# Patient Record
Sex: Female | Born: 1960 | Race: White | Hispanic: No | Marital: Married | Smoking: Current every day smoker
Health system: Southern US, Community
[De-identification: ages and names within clinical notes are randomized; demographics above are authoritative.]

## PROBLEM LIST (undated history)

## (undated) DIAGNOSIS — F419 Anxiety disorder, unspecified: Secondary | ICD-10-CM

## (undated) DIAGNOSIS — M419 Scoliosis, unspecified: Secondary | ICD-10-CM

## (undated) DIAGNOSIS — E039 Hypothyroidism, unspecified: Secondary | ICD-10-CM

## (undated) DIAGNOSIS — F32A Depression, unspecified: Secondary | ICD-10-CM

## (undated) DIAGNOSIS — R002 Palpitations: Secondary | ICD-10-CM

## (undated) DIAGNOSIS — I10 Essential (primary) hypertension: Secondary | ICD-10-CM

## (undated) DIAGNOSIS — F329 Major depressive disorder, single episode, unspecified: Secondary | ICD-10-CM

## (undated) HISTORY — DX: Essential (primary) hypertension: I10

## (undated) HISTORY — DX: Depression, unspecified: F32.A

## (undated) HISTORY — DX: Major depressive disorder, single episode, unspecified: F32.9

## (undated) HISTORY — DX: Hypothyroidism, unspecified: E03.9

## (undated) HISTORY — DX: Anxiety disorder, unspecified: F41.9

## (undated) HISTORY — DX: Palpitations: R00.2

## (undated) HISTORY — DX: Scoliosis, unspecified: M41.9

## (undated) HISTORY — PX: TONSILLECTOMY AND ADENOIDECTOMY: SUR1326

---

## 2010-10-29 ENCOUNTER — Encounter: Payer: Self-pay | Admitting: Cardiology

## 2010-10-29 ENCOUNTER — Encounter: Payer: Self-pay | Admitting: *Deleted

## 2010-10-29 ENCOUNTER — Ambulatory Visit (INDEPENDENT_AMBULATORY_CARE_PROVIDER_SITE_OTHER): Payer: BC Managed Care – PPO | Admitting: Cardiology

## 2010-10-29 VITALS — BP 135/88 | HR 82 | Ht 65.0 in | Wt 134.4 lb

## 2010-10-29 DIAGNOSIS — R0609 Other forms of dyspnea: Secondary | ICD-10-CM

## 2010-10-29 DIAGNOSIS — R0600 Dyspnea, unspecified: Secondary | ICD-10-CM | POA: Insufficient documentation

## 2010-10-29 DIAGNOSIS — R002 Palpitations: Secondary | ICD-10-CM | POA: Insufficient documentation

## 2010-10-29 DIAGNOSIS — R0989 Other specified symptoms and signs involving the circulatory and respiratory systems: Secondary | ICD-10-CM

## 2010-10-29 DIAGNOSIS — F419 Anxiety disorder, unspecified: Secondary | ICD-10-CM | POA: Insufficient documentation

## 2010-10-29 DIAGNOSIS — F17200 Nicotine dependence, unspecified, uncomplicated: Secondary | ICD-10-CM

## 2010-10-29 DIAGNOSIS — I10 Essential (primary) hypertension: Secondary | ICD-10-CM | POA: Insufficient documentation

## 2010-10-29 DIAGNOSIS — R609 Edema, unspecified: Secondary | ICD-10-CM | POA: Insufficient documentation

## 2010-10-29 DIAGNOSIS — Z720 Tobacco use: Secondary | ICD-10-CM | POA: Insufficient documentation

## 2010-10-29 DIAGNOSIS — R011 Cardiac murmur, unspecified: Secondary | ICD-10-CM | POA: Insufficient documentation

## 2010-10-29 MED ORDER — NICOTINE 14 MG/24HR TD PT24: 1.0000 | MEDICATED_PATCH | TRANSDERMAL | Status: AC

## 2010-10-29 NOTE — Assessment & Plan Note (Signed)
We discussed this at length. I gave her a prescription for nicotine patches.

## 2010-10-29 NOTE — Assessment & Plan Note (Signed)
Here blood pressure is borderline and no treatment is needed at this time.

## 2010-10-29 NOTE — Assessment & Plan Note (Signed)
I will check an echocardiogram to about a week the systolic murmur which may be aortic sclerosis.

## 2010-10-29 NOTE — Patient Instructions (Signed)
Follow up as scheduled. Nicoderm patch as directed. Your physician has requested that you have an echocardiogram. Echocardiography is a painless test that uses sound waves to create images of your heart. It provides your doctor with information about the size and shape of your heart and how well your heart's chambers and valves are working. This procedure takes approximately one hour. There are no restrictions for this procedure. Your physician has requested that you have an exercise tolerance test. For further information please visit [email or web address] Please also follow instruction sheet, as given. Your physician has recommended that you wear an event monitor. Event monitors are medical devices that record the heart's electrical activity. Doctors most often [place] these monitors to diagnose arrhythmias. Arrhythmias are problems with the speed or rhythm of the heartbeat. The monitor is a small, portable device. You can wear one while you do your normal daily activities. This is usually used to diagnose what is causing palpitations/syncope (passing out). Your physician discussed the hazards of tobacco use. Tobacco use cessation is recommended and techniques and options to help you quit were discussed.

## 2010-10-29 NOTE — Progress Notes (Signed)
HPI Patient presents for evaluation of tachycardia palpitations. This has been occurring for about 4 months. It has been on and off but progressive. It seems to have been now daily. It happens sporadically and at rest. She will feel her heart racing. She will feel lightheaded but has not had any frank syncope. She cannot bring these on. She does get dizzy spells apart from these as well very she does have some dyspnea with exertion and some mild intermittent chest discomfort is not reproducible with activity. She smokes cigarettes drinks caffeine every day. She has lost weight. She has a physical job doesn't exercise. She denies any PND or orthopnea. She has occasional mild lower extremity edema.   Allergies  Allergen Reactions  . Sulfa Antibiotics Hives and Swelling    Current Outpatient Prescriptions  Medication Sig Dispense Refill  . Ascorbic Acid (VITAMIN C) 500 MG tablet Take 500 mg by mouth daily.        . Aspirin-Salicylamide-Caffeine (BC HEADACHE POWDER PO) As needed.       Marland Kitchen buPROPion (WELLBUTRIN SR) 100 MG 12 hr tablet Take 100 mg by mouth daily.        . calcium carbonate (OS-CAL) 600 MG TABS Take 600 mg by mouth daily.        . clonazePAM (KLONOPIN) 1 MG tablet Take 1 mg by mouth. Takes 1/2 tab As needed.       . Omega-3 Fatty Acids (FISH OIL) 1200 MG CAPS Take by mouth 2 (two) times daily.        Marland Kitchen PARoxetine (PAXIL) 40 MG tablet Take 40 mg by mouth every morning.          Past Medical History  Diagnosis Date  . Palpitations   . HTN (hypertension)   . Edema   . Anxiety   . Tobacco abuse     Past Surgical History  Procedure Date  . Tonsillectomy and adenoidectomy     Family History  Problem Relation Age of Onset  . COPD Father     History   Social History  . Marital Status: Unknown    Spouse Name: N/A    Number of Children: 2  . Years of Education: N/A   Occupational History  . Engineer, drilling    Social History Main Topics  . Smoking status: Current  Everyday Smoker -- 2.0 packs/day  . Smokeless tobacco: Not on file  . Alcohol Use: No  . Drug Use: No  . Sexually Active: Not on file   Other Topics Concern  . Not on file   Social History Narrative  . No narrative on file    ROS:  Positive for headaches, sweats, fatigue, hearing loss, weakness, anxiety/depression, anemia, heartburn, stomach cramps, eczema, joint pains. Otherwise as stated in the history of present illness and negative for all other systems.  PHYSICAL EXAM BP 135/88  Pulse 82  Ht 5\' 5"  (1.651 m)  Wt 134 lb 6.4 oz (60.963 kg)  BMI 22.37 kg/m2 GENERAL:  Well appearing HEENT:  Pupils equal round and reactive, fundi not visualized, oral mucosa unremarkable NECK:  No jugular venous distention, waveform within normal limits, carotid upstroke brisk and symmetric, no bruits, no thyromegaly LYMPHATICS:  No cervical, inguinal adenopathy LUNGS:  Clear to auscultation bilaterally BACK:  No CVA tenderness CHEST:  Unremarkable HEART:  PMI not displaced or sustained,S1 and S2 within normal limits, no S3, no S4, no clicks, no rubs, soft apical and right upper sternal border murmur ABD:  Flat, positive  bowel sounds normal in frequency in pitch, no bruits, no rebound, no guarding, no midline pulsatile mass, no hepatomegaly, no splenomegaly EXT:  2 plus pulses throughout, no edema, no cyanosis no clubbing SKIN:  No rashes no nodules NEURO:  Cranial nerves II through XII grossly intact, motor grossly intact throughout PSYCH:  Cognitively intact, oriented to person place and time  EKG:   Sinus rhythm, rate 77, axis within normal limits, intervals within normal limits, no acute ST-T wave changes.  ASSESSMENT AND PLAN

## 2010-10-29 NOTE — Assessment & Plan Note (Signed)
She has some dyspnea. She has significant risk factors. I will screen with an exercise treadmill test.

## 2010-10-29 NOTE — Assessment & Plan Note (Signed)
She did have recent labs that were normal. I will check a 21 day event monitor.

## 2010-10-30 ENCOUNTER — Encounter: Payer: Self-pay | Admitting: Cardiology

## 2010-11-02 DIAGNOSIS — R0602 Shortness of breath: Secondary | ICD-10-CM

## 2010-11-02 DIAGNOSIS — R002 Palpitations: Secondary | ICD-10-CM

## 2010-11-09 DIAGNOSIS — R002 Palpitations: Secondary | ICD-10-CM

## 2010-12-11 ENCOUNTER — Ambulatory Visit: Payer: BC Managed Care – PPO | Admitting: Cardiology

## 2010-12-13 ENCOUNTER — Encounter: Payer: Self-pay | Admitting: *Deleted

## 2012-08-06 ENCOUNTER — Ambulatory Visit: Payer: BC Managed Care – PPO | Admitting: Physical Therapy

## 2013-11-30 ENCOUNTER — Other Ambulatory Visit: Payer: Self-pay | Admitting: Specialist

## 2013-11-30 DIAGNOSIS — M545 Low back pain, unspecified: Secondary | ICD-10-CM

## 2013-12-02 ENCOUNTER — Ambulatory Visit
Admission: RE | Admit: 2013-12-02 | Discharge: 2013-12-02 | Disposition: A | Discharge status: Home / Self Care | Payer: 59 | Source: Ambulatory Visit | Attending: Specialist | Admitting: Specialist

## 2013-12-02 DIAGNOSIS — M545 Low back pain, unspecified: Secondary | ICD-10-CM

## 2013-12-17 ENCOUNTER — Other Ambulatory Visit: Payer: Self-pay | Admitting: Specialist

## 2013-12-17 DIAGNOSIS — N281 Cyst of kidney, acquired: Secondary | ICD-10-CM

## 2013-12-21 ENCOUNTER — Ambulatory Visit
Admission: RE | Admit: 2013-12-21 | Discharge: 2013-12-21 | Disposition: A | Discharge status: Home / Self Care | Payer: 59 | Source: Ambulatory Visit | Attending: Specialist | Admitting: Specialist

## 2013-12-21 DIAGNOSIS — N281 Cyst of kidney, acquired: Secondary | ICD-10-CM

## 2013-12-27 ENCOUNTER — Ambulatory Visit (INDEPENDENT_AMBULATORY_CARE_PROVIDER_SITE_OTHER): Payer: 59 | Admitting: General Practice

## 2013-12-27 ENCOUNTER — Encounter: Payer: Self-pay | Admitting: General Practice

## 2013-12-27 ENCOUNTER — Ambulatory Visit (INDEPENDENT_AMBULATORY_CARE_PROVIDER_SITE_OTHER): Payer: 59

## 2013-12-27 ENCOUNTER — Telehealth: Payer: Self-pay | Admitting: Family Medicine

## 2013-12-27 VITALS — BP 120/86 | HR 62 | Temp 98.6°F | Ht 65.0 in | Wt 151.4 lb

## 2013-12-27 DIAGNOSIS — K5900 Constipation, unspecified: Secondary | ICD-10-CM

## 2013-12-27 DIAGNOSIS — R109 Unspecified abdominal pain: Secondary | ICD-10-CM

## 2013-12-27 NOTE — Progress Notes (Signed)
   Subjective:    Patient ID: Connie Schultz, female    DOB: 04/14/1961, 53 y.o.   MRN: [id number]  Abdominal Pain This is a new problem. Episode onset: onset 3 days ago. The onset quality is gradual. The problem occurs daily. The problem has been unchanged. The pain is located in the generalized abdominal region. The pain is at a severity of 3/10. The quality of the pain is aching. The abdominal pain does not radiate. Associated symptoms include constipation and diarrhea. Pertinent negatives include no dysuria, fever, frequency, headaches, hematuria, nausea, vomiting or weight loss. The pain is aggravated by eating. The pain is relieved by nothing. She has tried nothing for the symptoms. Her past medical history is significant for GERD.  Patient reports a history of chronic constipation, but no pharmacological management. Connie Schultz reports her normal bowel movements occur once weekly. Reports being seen by GI specialist 3 years ago and follow up colonoscopy 2 more years.     Review of Systems  Constitutional: Negative for fever, chills and weight loss.  Respiratory: Negative for chest tightness and shortness of breath.   Cardiovascular: Negative for chest pain and palpitations.  Gastrointestinal: Positive for abdominal pain, diarrhea and constipation. Negative for nausea and vomiting.  Genitourinary: Negative for dysuria, frequency and hematuria.  Neurological: Negative for dizziness, weakness and headaches.       Objective:   Physical Exam  Constitutional: She is oriented to person, place, and time. She appears well-developed and well-nourished.  Cardiovascular: Normal rate, regular rhythm and normal heart sounds.   Pulmonary/Chest: Effort normal and breath sounds normal.  Abdominal: Soft. Bowel sounds are normal. She exhibits no distension. There is generalized tenderness. There is no CVA tenderness.  Neurological: She is alert and oriented to person, place, and time.  Skin: Skin is warm and  dry.  Psychiatric: She has a normal mood and affect.   [facility] reading (PRIMARY) by Coralie KeensMae E. Anjel Pardo, FNP-C, moderate amount of stool noted in colon.        Assessment & Plan:  1. Unspecified constipation, 2. Abdominal pain, unspecified abdominal location -discussed and provided patient educational material on constipation -Miralax 17grams daily, for 1-4 days, until bowel movement  Increase fluid intake (water) Increase fiber in diet (fruits, vegetables, whole grains) Take stool softner daily Follow up if no bowel movement in 24 hours Seek emergency medical treatment if abdominal pain worsens If symptoms persists, will refer to GI Patient verbalized understanding Coralie KeensMae E. Glennis Borger, FNP-C

## 2013-12-27 NOTE — Patient Instructions (Signed)

## 2013-12-27 NOTE — Telephone Encounter (Signed)
Appt given for today per patients request 

## 2014-01-17 ENCOUNTER — Telehealth: Payer: Self-pay | Admitting: General Practice

## 2014-01-17 NOTE — Telephone Encounter (Signed)
appt given per patients request 

## 2014-01-18 ENCOUNTER — Ambulatory Visit: Payer: 59 | Admitting: Nurse Practitioner

## 2014-01-24 ENCOUNTER — Telehealth: Payer: Self-pay | Admitting: Nurse Practitioner

## 2014-01-24 DIAGNOSIS — R194 Change in bowel habit: Secondary | ICD-10-CM

## 2014-01-24 NOTE — Telephone Encounter (Signed)
referral  Made \

## 2014-01-24 NOTE — Telephone Encounter (Signed)
Patient notified

## 2014-01-25 ENCOUNTER — Telehealth: Payer: Self-pay | Admitting: Nurse Practitioner

## 2014-01-25 NOTE — Telephone Encounter (Signed)
Patient aware will come back in if gets any worse please check on referral

## 2014-01-25 NOTE — Telephone Encounter (Signed)
Stool softner Force fluids Increase fiber in diet I am not real sure what is going in because she was seen by M. [place]- may NTBS

## 2014-05-11 ENCOUNTER — Telehealth: Payer: Self-pay | Admitting: Family Medicine

## 2014-05-11 NOTE — Telephone Encounter (Signed)
Pt given appt with bill oxford 11/20 @ 10:45

## 2014-05-11 NOTE — Telephone Encounter (Signed)
Returning Karla's call.  Call [phone]

## 2014-05-13 ENCOUNTER — Ambulatory Visit: Payer: 59 | Admitting: Family Medicine

## 2014-08-02 ENCOUNTER — Telehealth: Payer: Self-pay | Admitting: Family Medicine

## 2014-08-02 NOTE — Telephone Encounter (Signed)
Patient aware she will need to be seen  

## 2014-08-09 ENCOUNTER — Telehealth: Payer: Self-pay | Admitting: Nurse Practitioner

## 2014-08-09 ENCOUNTER — Telehealth: Payer: Self-pay | Admitting: Family

## 2014-08-09 NOTE — Telephone Encounter (Signed)
Appointment given for Thursday at 4:40 with Jannifer Rodneyhristy Hawks, FNP

## 2014-08-10 NOTE — Telephone Encounter (Signed)
Attempted to call patient at number but it wasn't a working number. Tried home number with no answer. No lab orders have come in per medical records and lab

## 2014-08-11 ENCOUNTER — Telehealth: Payer: Self-pay | Admitting: Family

## 2014-08-11 ENCOUNTER — Ambulatory Visit: Payer: Self-pay | Admitting: Family

## 2014-08-12 NOTE — Telephone Encounter (Signed)
Pt given appt with Jannifer Rodneyhristy Hawks 2/24 at 9:50.

## 2014-08-17 ENCOUNTER — Encounter: Payer: Self-pay | Admitting: Family

## 2014-08-17 ENCOUNTER — Ambulatory Visit (INDEPENDENT_AMBULATORY_CARE_PROVIDER_SITE_OTHER): Payer: 59 | Admitting: Family

## 2014-08-17 VITALS — BP 101/69 | HR 82 | Temp 97.4°F | Ht 65.0 in | Wt 149.0 lb

## 2014-08-17 DIAGNOSIS — F329 Major depressive disorder, single episode, unspecified: Secondary | ICD-10-CM

## 2014-08-17 DIAGNOSIS — F419 Anxiety disorder, unspecified: Secondary | ICD-10-CM

## 2014-08-17 DIAGNOSIS — I4891 Unspecified atrial fibrillation: Secondary | ICD-10-CM

## 2014-08-17 DIAGNOSIS — E039 Hypothyroidism, unspecified: Secondary | ICD-10-CM

## 2014-08-17 DIAGNOSIS — F32A Depression, unspecified: Secondary | ICD-10-CM

## 2014-08-17 DIAGNOSIS — I10 Essential (primary) hypertension: Secondary | ICD-10-CM

## 2014-08-17 DIAGNOSIS — Z13220 Encounter for screening for lipoid disorders: Secondary | ICD-10-CM

## 2014-08-17 DIAGNOSIS — Z1321 Encounter for screening for nutritional disorder: Secondary | ICD-10-CM

## 2014-08-17 DIAGNOSIS — Z23 Encounter for immunization: Secondary | ICD-10-CM

## 2014-08-17 MED ORDER — PAROXETINE HCL 40 MG PO TABS: 40.0000 mg | ORAL_TABLET | ORAL | Status: DC

## 2014-08-17 MED ORDER — METOPROLOL SUCCINATE ER 25 MG PO TB24: 25.0000 mg | ORAL_TABLET | Freq: Every day | ORAL | Status: DC

## 2014-08-17 MED ORDER — CLONAZEPAM 1 MG PO TABS: 1.0000 mg | ORAL_TABLET | Freq: Every day | ORAL | Status: DC

## 2014-08-17 MED ORDER — LEVOTHYROXINE SODIUM 50 MCG PO TABS: 50.0000 ug | ORAL_TABLET | Freq: Every day | ORAL | Status: DC

## 2014-08-17 NOTE — Progress Notes (Signed)
Subjective:    Patient ID: Connie Schultz, female    DOB: Mar 28, 1961, 54 y.o.   MRN: [id number]  Hypertension This is a chronic problem. The current episode started more than 1 year ago. The problem has been resolved since onset. The problem is controlled. Associated symptoms include anxiety, palpitations and peripheral edema ("at times"). Pertinent negatives include no headaches, malaise/fatigue or shortness of breath. Risk factors for coronary artery disease include post-menopausal state and sedentary lifestyle. Past treatments include beta blockers. The current treatment provides moderate improvement. Hypertensive end-organ damage includes a thyroid problem. There is no history of kidney disease, CAD/MI, CVA or heart failure. There is no history of sleep apnea.  Thyroid Problem Presents for initial visit. Symptoms include anxiety, constipation, depressed mood and palpitations. Patient reports no diarrhea, dry skin, hair loss or visual change. The symptoms have been worsening. Past treatments include levothyroxine. The treatment provided moderate relief. There is no history of heart failure.  Anxiety Presents for initial visit. Onset was 1 to 6 months ago. The problem has been resolved. Symptoms include depressed mood, nervous/anxious behavior and palpitations. Patient reports no excessive worry, insomnia or shortness of breath. Symptoms occur occasionally.   Her past medical history is significant for anxiety/panic attacks and depression.  A Fib Pt currently taking metoprolol daily. Pt states she still have palpations at times.     Review of Systems  Constitutional: Negative.  Negative for malaise/fatigue.  HENT: Negative.   Eyes: Negative.   Respiratory: Negative.  Negative for shortness of breath.   Cardiovascular: Positive for palpitations.  Gastrointestinal: Positive for constipation. Negative for diarrhea.  Endocrine: Negative.   Genitourinary: Negative.   Musculoskeletal: Negative.    Neurological: Negative.  Negative for headaches.  Hematological: Negative.   Psychiatric/Behavioral: The patient is nervous/anxious. The patient does not have insomnia.   All other systems reviewed and are negative.      Objective:   Physical Exam  Constitutional: She is oriented to person, place, and time. She appears well-developed and well-nourished. No distress.  HENT:  Head: Normocephalic and atraumatic.  Right Ear: External ear normal.  Left Ear: External ear normal.  Nose: Nose normal.  Mouth/Throat: Oropharynx is clear and moist.  Eyes: Pupils are equal, round, and reactive to light.  Neck: Normal range of motion. Neck supple. No thyromegaly present.  Cardiovascular: Normal rate, regular rhythm, normal heart sounds and intact distal pulses.   No murmur heard. Pulmonary/Chest: Effort normal and breath sounds normal. No respiratory distress. She has no wheezes.  Abdominal: Soft. Bowel sounds are normal. She exhibits no distension. There is no tenderness.  Musculoskeletal: Normal range of motion. She exhibits no edema or tenderness.  Neurological: She is alert and oriented to person, place, and time. She has normal reflexes. No cranial nerve deficit.  Skin: Skin is warm and dry.  Psychiatric: She has a normal mood and affect. Her behavior is normal. Judgment and thought content normal.  Vitals reviewed.     BP 101/69 mmHg  Pulse 82  Temp(Src) 97.4 F (36.3 C) (Oral)  Ht 5' 5"  (1.651 m)  Wt 149 lb (67.586 kg)  BMI 24.79 kg/m2     Assessment & Plan:  1. Essential hypertension - CMP14+EGFR - metoprolol succinate (TOPROL-XL) 25 MG 24 hr tablet; Take 1 tablet (25 mg total) by mouth daily.  Dispense: 90 tablet; Refill: 3  2. Anxiety - CMP14+EGFR - PARoxetine (PAXIL) 40 MG tablet; Take 1 tablet (40 mg total) by mouth every morning.  Dispense:  90 tablet; Refill: 3 - clonazePAM (KLONOPIN) 1 MG tablet; Take 1 tablet (1 mg total) by mouth at bedtime.  Dispense: 30 tablet;  Refill: 3  3. Depression - CMP14+EGFR - PARoxetine (PAXIL) 40 MG tablet; Take 1 tablet (40 mg total) by mouth every morning.  Dispense: 90 tablet; Refill: 3  4. Hypothyroidism, unspecified hypothyroidism type - levothyroxine (SYNTHROID, LEVOTHROID) 50 MCG tablet; Take 1 tablet (50 mcg total) by mouth daily.  Dispense: 90 tablet; Refill: 3 - CMP14+EGFR  5. Atrial fibrillation, unspecified - CMP14+EGFR - metoprolol succinate (TOPROL-XL) 25 MG 24 hr tablet; Take 1 tablet (25 mg total) by mouth daily.  Dispense: 90 tablet; Refill: 3  6. Screening for cholesterol leve - Lipid panel  7. Encounter for vitamin deficiency screening  - Vit D  25 hydroxy (rtn osteoporosis monitoring)   Continue all meds Labs pending Health Maintenance reviewed Diet and exercise encouraged RTO 6 weeks for thyroid recheck  Evelina Dun, FNP

## 2014-08-17 NOTE — Addendum Note (Signed)
Addended by: Tommas OlpHANDY, Derrien Anschutz N on: 08/17/2014 02:06 PM   Modules accepted: Orders

## 2014-08-17 NOTE — Addendum Note (Signed)
Addended by: Gwenith DailyHUDY, Seyed Heffley N on: 08/17/2014 10:41 AM   Modules accepted: Orders

## 2014-08-17 NOTE — Patient Instructions (Signed)
Health Maintenance Adopting a healthy lifestyle and getting preventive care can go a long way to promote health and wellness. Talk with your health care provider about what schedule of regular examinations is right for you. This is a good chance for you to check in with your provider about disease prevention and staying healthy. In between checkups, there are plenty of things you can do on your own. Experts have done a lot of research about which lifestyle changes and preventive measures are most likely to keep you healthy. Ask your health care provider for more information. WEIGHT AND DIET  Eat a healthy diet  Be sure to include plenty of vegetables, fruits, low-fat dairy products, and lean protein.  Do not eat a lot of foods high in solid fats, added sugars, or salt.  Get regular exercise. This is one of the most important things you can do for your health.  Most adults should exercise for at least 150 minutes each week. The exercise should increase your heart rate and make you sweat (moderate-intensity exercise).  Most adults should also do strengthening exercises at least twice a week. This is in addition to the moderate-intensity exercise.  Maintain a healthy weight  Body mass index (BMI) is a measurement that can be used to identify possible weight problems. It estimates body fat based on height and weight. Your health care provider can help determine your BMI and help you achieve or maintain a healthy weight.  For females 25 years of age and older:   A BMI below 18.5 is considered underweight.  A BMI of 18.5 to 24.9 is normal.  A BMI of 25 to 29.9 is considered overweight.  A BMI of 30 and above is considered obese.  Watch levels of cholesterol and blood lipids  You should start having your blood tested for lipids and cholesterol at 52 years of age, then have this test every 5 years.  You may need to have your cholesterol levels checked more often if:  Your lipid or  cholesterol levels are high.  You are older than 52 years of age.  You are at high risk for heart disease.  CANCER SCREENING   Lung Cancer  Lung cancer screening is recommended for adults 97-92 years old who are at high risk for lung cancer because of a history of smoking.  A yearly low-dose CT scan of the lungs is recommended for people who:  Currently smoke.  Have quit within the past 15 years.  Have at least a 30-pack-year history of smoking. A pack year is smoking an average of one pack of cigarettes a day for 1 year.  Yearly screening should continue until it has been 15 years since you quit.  Yearly screening should stop if you develop a health problem that would prevent you from having lung cancer treatment.  Breast Cancer  Practice breast self-awareness. This means understanding how your breasts normally appear and feel.  It also means doing regular breast self-exams. Let your health care provider know about any changes, no matter how small.  If you are in your 20s or 30s, you should have a clinical breast exam (CBE) by a health care provider every 1-3 years as part of a regular health exam.  If you are 76 or older, have a CBE every year. Also consider having a breast X-ray (mammogram) every year.  If you have a family history of breast cancer, talk to your health care provider about genetic screening.  If you are  at high risk for breast cancer, talk to your health care provider about having an MRI and a mammogram every year.  Breast cancer gene (BRCA) assessment is recommended for women who have family members with BRCA-related cancers. BRCA-related cancers include:  Breast.  Ovarian.  Tubal.  Peritoneal cancers.  Results of the assessment will determine the need for genetic counseling and BRCA1 and BRCA2 testing. Cervical Cancer Routine pelvic examinations to screen for cervical cancer are no longer recommended for nonpregnant women who are considered low  risk for cancer of the pelvic organs (ovaries, uterus, and vagina) and who do not have symptoms. A pelvic examination may be necessary if you have symptoms including those associated with pelvic infections. Ask your health care provider if a screening pelvic exam is right for you.   The Pap test is the screening test for cervical cancer for women who are considered at risk.  If you had a hysterectomy for a problem that was not cancer or a condition that could lead to cancer, then you no longer need Pap tests.  If you are older than 65 years, and you have had normal Pap tests for the past 10 years, you no longer need to have Pap tests.  If you have had past treatment for cervical cancer or a condition that could lead to cancer, you need Pap tests and screening for cancer for at least 20 years after your treatment.  If you no longer get a Pap test, assess your risk factors if they change (such as having a new sexual partner). This can affect whether you should start being screened again.  Some women have medical problems that increase their chance of getting cervical cancer. If this is the case for you, your health care provider may recommend more frequent screening and Pap tests.  The human papillomavirus (HPV) test is another test that may be used for cervical cancer screening. The HPV test looks for the virus that can cause cell changes in the cervix. The cells collected during the Pap test can be tested for HPV.  The HPV test can be used to screen women 30 years of age and older. Getting tested for HPV can extend the interval between normal Pap tests from three to five years.  An HPV test also should be used to screen women of any age who have unclear Pap test results.  After 52 years of age, women should have HPV testing as often as Pap tests.  Colorectal Cancer  This type of cancer can be detected and often prevented.  Routine colorectal cancer screening usually begins at 52 years of  age and continues through 52 years of age.  Your health care provider may recommend screening at an earlier age if you have risk factors for colon cancer.  Your health care provider may also recommend using home test kits to check for hidden blood in the stool.  A small camera at the end of a tube can be used to examine your colon directly (sigmoidoscopy or colonoscopy). This is done to check for the earliest forms of colorectal cancer.  Routine screening usually begins at age 50.  Direct examination of the colon should be repeated every 5-10 years through 52 years of age. However, you may need to be screened more often if early forms of precancerous polyps or small growths are found. Skin Cancer  Check your skin from head to toe regularly.  Tell your health care provider about any new moles or changes in   moles, especially if there is a change in a mole's shape or color.  Also tell your health care provider if you have a mole that is larger than the size of a pencil eraser.  Always use sunscreen. Apply sunscreen liberally and repeatedly throughout the day.  Protect yourself by wearing long sleeves, pants, a wide-brimmed hat, and sunglasses whenever you are outside. HEART DISEASE, DIABETES, AND HIGH BLOOD PRESSURE   Have your blood pressure checked at least every 1-2 years. High blood pressure causes heart disease and increases the risk of stroke.  If you are between 75 years and 42 years old, ask your health care provider if you should take aspirin to prevent strokes.  Have regular diabetes screenings. This involves taking a blood sample to check your fasting blood sugar level.  If you are at a normal weight and have a low risk for diabetes, have this test once every three years after 52 years of age.  If you are overweight and have a high risk for diabetes, consider being tested at a younger age or more often. PREVENTING INFECTION  Hepatitis B  If you have a higher risk for  hepatitis B, you should be screened for this virus. You are considered at high risk for hepatitis B if:  You were born in a country where hepatitis B is common. Ask your health care provider which countries are considered high risk.  Your parents were born in a high-risk country, and you have not been immunized against hepatitis B (hepatitis B vaccine).  You have HIV or AIDS.  You use needles to inject street drugs.  You live with someone who has hepatitis B.  You have had sex with someone who has hepatitis B.  You get hemodialysis treatment.  You take certain medicines for conditions, including cancer, organ transplantation, and autoimmune conditions. Hepatitis C  Blood testing is recommended for:  Everyone born from [birth year] through 1963.  Anyone with known risk factors for hepatitis C. Sexually transmitted infections (STIs)  You should be screened for sexually transmitted infections (STIs) including gonorrhea and chlamydia if:  You are sexually active and are younger than 52 years of age.  You are older than 52 years of age and your health care provider tells you that you are at risk for this type of infection.  Your sexual activity has changed since you were last screened and you are at an increased risk for chlamydia or gonorrhea. Ask your health care provider if you are at risk.  If you do not have HIV, but are at risk, it may be recommended that you take a prescription medicine daily to prevent HIV infection. This is called pre-exposure prophylaxis (PrEP). You are considered at risk if:  You are sexually active and do not regularly use condoms or know the HIV status of your partner(s).  You take drugs by injection.  You are sexually active with a partner who has HIV. Talk with your health care provider about whether you are at high risk of being infected with HIV. If you choose to begin PrEP, you should first be tested for HIV. You should then be tested every 3 months for  as long as you are taking PrEP.  PREGNANCY   If you are premenopausal and you may become pregnant, ask your health care provider about preconception counseling.  If you may become pregnant, take 400 to 800 micrograms (mcg) of folic acid every day.  If you want to prevent pregnancy, talk to your  health care provider about birth control (contraception). OSTEOPOROSIS AND MENOPAUSE   Osteoporosis is a disease in which the bones lose minerals and strength with aging. This can result in serious bone fractures. Your risk for osteoporosis can be identified using a bone density scan.  If you are 65 years of age or older, or if you are at risk for osteoporosis and fractures, ask your health care provider if you should be screened.  Ask your health care provider whether you should take a calcium or vitamin D supplement to lower your risk for osteoporosis.  Menopause may have certain physical symptoms and risks.  Hormone replacement therapy may reduce some of these symptoms and risks. Talk to your health care provider about whether hormone replacement therapy is right for you.  HOME CARE INSTRUCTIONS   Schedule regular health, dental, and eye exams.  Stay current with your immunizations.   Do not use any tobacco products including cigarettes, chewing tobacco, or electronic cigarettes.  If you are pregnant, do not drink alcohol.  If you are breastfeeding, limit how much and how often you drink alcohol.  Limit alcohol intake to no more than 1 drink per day for nonpregnant women. One drink equals 12 ounces of beer, 5 ounces of wine, or 1 ounces of hard liquor.  Do not use street drugs.  Do not share needles.  Ask your health care provider for help if you need support or information about quitting drugs.  Tell your health care provider if you often feel depressed.  Tell your health care provider if you have ever been abused or do not feel safe at home. Document Released: 12/24/2010  Document Revised: 10/25/2013 Document Reviewed: 05/12/2013 Exit[employer] Patient Information 2015 [employer]. This information is not intended to replace advice given to you by your health care provider. Make sure you discuss any questions you have with your health care provider. Hypothyroidism The thyroid is a large gland located in the lower front of your neck. The thyroid gland helps control metabolism. Metabolism is how your body handles food. It controls metabolism with the hormone thyroxine. When this gland is underactive (hypothyroid), it produces too little hormone.  CAUSES These include:   Absence or destruction of thyroid tissue.  Goiter due to iodine deficiency.  Goiter due to medications.  Congenital defects (since birth).  Problems with the pituitary. This causes a lack of TSH (thyroid stimulating hormone). This hormone tells the thyroid to turn out more hormone. SYMPTOMS  Lethargy (feeling as though you have no energy)  Cold intolerance  Weight gain (in spite of normal food intake)  Dry skin  Coarse hair  Menstrual irregularity (if severe, may lead to infertility)  Slowing of thought processes Cardiac problems are also caused by insufficient amounts of thyroid hormone. Hypothyroidism in the newborn is cretinism, and is an extreme form. It is important that this form be treated adequately and immediately or it will lead rapidly to retarded physical and mental development. DIAGNOSIS  To prove hypothyroidism, your caregiver may do blood tests and ultrasound tests. Sometimes the signs are hidden. It may be necessary for your caregiver to watch this illness with blood tests either before or after diagnosis and treatment. TREATMENT  Low levels of thyroid hormone are increased by using synthetic thyroid hormone. This is a safe, effective treatment. It usually takes about four weeks to gain the full effects of the medication. After you have the full effect of the medication,  it will generally take another four weeks   for problems to leave. Your caregiver may start you on low doses. If you have had heart problems the dose may be gradually increased. It is generally not an emergency to get rapidly to normal. HOME CARE INSTRUCTIONS   Take your medications as your caregiver suggests. Let your caregiver know of any medications you are taking or start taking. Your caregiver will help you with dosage schedules.  As your condition improves, your dosage needs may increase. It will be necessary to have continuing blood tests as suggested by your caregiver.  Report all suspected medication side effects to your caregiver. SEEK MEDICAL CARE IF: Seek medical care if you develop:  Sweating.  Tremulousness (tremors).  Anxiety.  Rapid weight loss.  Heat intolerance.  Emotional swings.  Diarrhea.  Weakness. SEEK IMMEDIATE MEDICAL CARE IF:  You develop chest pain, an irregular heart beat (palpitations), or a rapid heart beat. MAKE SURE YOU:   Understand these instructions.  Will watch your condition.  Will get help right away if you are not doing well or get worse. Document Released: 06/10/2005 Document Revised: 09/02/2011 Document Reviewed: 01/29/2008 Exit[employer] Patient Information 2015 [employer]. This information is not intended to replace advice given to you by your health care provider. Make sure you discuss any questions you have with your health care provider.  

## 2014-09-22 ENCOUNTER — Ambulatory Visit: Payer: 59 | Admitting: Physician Assistant

## 2014-09-22 ENCOUNTER — Encounter: Payer: Self-pay | Admitting: Physician Assistant

## 2014-09-22 VITALS — BP 116/77 | HR 71 | Temp 97.2°F | Ht 65.0 in | Wt 148.6 lb

## 2014-09-22 DIAGNOSIS — K529 Noninfective gastroenteritis and colitis, unspecified: Secondary | ICD-10-CM

## 2014-09-22 DIAGNOSIS — R1033 Periumbilical pain: Secondary | ICD-10-CM

## 2014-09-22 DIAGNOSIS — R1011 Right upper quadrant pain: Secondary | ICD-10-CM

## 2014-09-22 NOTE — Progress Notes (Signed)
   Subjective:    Patient ID: Connie Schultz, female    DOB: 12/09/1960, 54 y.o.   MRN: [id number]  HPI 54 y/o with c/o low to mid back pain, periumbilical pain x 3 days. Has took Tylenol with mild relief in back pain. Worse with eating. Better with not eating.     Review of Systems  Constitutional: Negative.   Respiratory: Negative.   Cardiovascular: Negative.   Gastrointestinal: Positive for nausea, abdominal pain (ache, burning, periumbilical) and diarrhea (chronic). Negative for vomiting, constipation and blood in stool.  Genitourinary: Negative for dysuria, urgency, hematuria, flank pain, vaginal bleeding and difficulty urinating.  Musculoskeletal: Positive for back pain (low ;to mid back).  Neurological: Positive for dizziness.       Objective:   Physical Exam  Constitutional: She appears well-developed and well-nourished. No distress.  Pulmonary/Chest: Effort normal and breath sounds normal.  Abdominal: Soft. Bowel sounds are normal. She exhibits no distension and no mass. There is tenderness (RUQ, periumbilical). There is no rebound and no guarding.  Negative murphys sign   Musculoskeletal: Normal range of motion. She exhibits no tenderness.  Negative for ttp on back  Neurological: She is alert.  Skin: Skin is warm. She is not diaphoretic.  Vitals reviewed.         Assessment & Plan:  1. RUQ pain 2. Back pain 3. Periumbilical pain 4. Chronic diarrhea  Referral to Gastroenterology for further evaluation to r/o cholelithiasis. Report to ER if s/s worsen prior to GI appt. Also suggested patient take OTC Pepcid/Prilosec daily

## 2014-09-29 ENCOUNTER — Ambulatory Visit (INDEPENDENT_AMBULATORY_CARE_PROVIDER_SITE_OTHER): Payer: 59 | Admitting: Family

## 2014-09-29 ENCOUNTER — Encounter: Payer: Self-pay | Admitting: Family

## 2014-09-29 VITALS — BP 114/73 | HR 83 | Temp 98.0°F | Ht 65.0 in | Wt 145.0 lb

## 2014-09-29 DIAGNOSIS — E039 Hypothyroidism, unspecified: Secondary | ICD-10-CM

## 2014-09-29 NOTE — Patient Instructions (Signed)
Hypothyroidism The thyroid is a large gland located in the lower front of your neck. The thyroid gland helps control metabolism. Metabolism is how your body handles food. It controls metabolism with the hormone thyroxine. When this gland is underactive (hypothyroid), it produces too little hormone.  CAUSES These include:   Absence or destruction of thyroid tissue.  Goiter due to iodine deficiency.  Goiter due to medications.  Congenital defects (since birth).  Problems with the pituitary. This causes a lack of TSH (thyroid stimulating hormone). This hormone tells the thyroid to turn out more hormone. SYMPTOMS  Lethargy (feeling as though you have no energy)  Cold intolerance  Weight gain (in spite of normal food intake)  Dry skin  Coarse hair  Menstrual irregularity (if severe, may lead to infertility)  Slowing of thought processes Cardiac problems are also caused by insufficient amounts of thyroid hormone. Hypothyroidism in the newborn is cretinism, and is an extreme form. It is important that this form be treated adequately and immediately or it will lead rapidly to retarded physical and mental development. DIAGNOSIS  To prove hypothyroidism, your caregiver may do blood tests and ultrasound tests. Sometimes the signs are hidden. It may be necessary for your caregiver to watch this illness with blood tests either before or after diagnosis and treatment. TREATMENT  Low levels of thyroid hormone are increased by using synthetic thyroid hormone. This is a safe, effective treatment. It usually takes about four weeks to gain the full effects of the medication. After you have the full effect of the medication, it will generally take another four weeks for problems to leave. Your caregiver may start you on low doses. If you have had heart problems the dose may be gradually increased. It is generally not an emergency to get rapidly to normal. HOME CARE INSTRUCTIONS   Take your  medications as your caregiver suggests. Let your caregiver know of any medications you are taking or start taking. Your caregiver will help you with dosage schedules.  As your condition improves, your dosage needs may increase. It will be necessary to have continuing blood tests as suggested by your caregiver.  Report all suspected medication side effects to your caregiver. SEEK MEDICAL CARE IF: Seek medical care if you develop:  Sweating.  Tremulousness (tremors).  Anxiety.  Rapid weight loss.  Heat intolerance.  Emotional swings.  Diarrhea.  Weakness. SEEK IMMEDIATE MEDICAL CARE IF:  You develop chest pain, an irregular heart beat (palpitations), or a rapid heart beat. MAKE SURE YOU:   Understand these instructions.  Will watch your condition.  Will get help right away if you are not doing well or get worse. Document Released: 06/10/2005 Document Revised: 09/02/2011 Document Reviewed: 01/29/2008 Exit[employer] Patient Information 2015 [employer]. This information is not intended to replace advice given to you by your health care provider. Make sure you discuss any questions you have with your health care provider.  

## 2014-09-29 NOTE — Progress Notes (Signed)
   Subjective:    Patient ID: Connie Schultz, female    DOB: 03/11/1961, 54 y.o.   MRN: [id number]  Pt presents to the office to recheck thyroid.  Thyroid Problem Presents for follow-up visit. Symptoms include diarrhea and dry skin. Patient reports no constipation, heat intolerance, palpitations or weight gain. The symptoms have been stable. Past treatments include levothyroxine. The treatment provided moderate relief.      Review of Systems  Constitutional: Negative.  Negative for weight gain.  HENT: Negative.   Eyes: Negative.   Respiratory: Negative.  Negative for shortness of breath.   Cardiovascular: Negative.  Negative for palpitations.  Gastrointestinal: Positive for diarrhea. Negative for constipation.  Endocrine: Negative.  Negative for heat intolerance.  Genitourinary: Negative.   Musculoskeletal: Negative.   Neurological: Negative.  Negative for headaches.  Hematological: Negative.   Psychiatric/Behavioral: Negative.   All other systems reviewed and are negative.      Objective:   Physical Exam  Constitutional: She is oriented to person, place, and time. She appears well-developed and well-nourished. No distress.  HENT:  Head: Normocephalic and atraumatic.  Right Ear: External ear normal.  Left Ear: External ear normal.  Nose: Nose normal.  Mouth/Throat: Oropharynx is clear and moist.  Eyes: Pupils are equal, round, and reactive to light.  Neck: Normal range of motion. Neck supple. No thyromegaly present.  Cardiovascular: Normal rate, regular rhythm, normal heart sounds and intact distal pulses.   No murmur heard. Pulmonary/Chest: Effort normal and breath sounds normal. No respiratory distress. She has no wheezes.  Abdominal: Soft. Bowel sounds are normal. She exhibits no distension. There is no tenderness.  Musculoskeletal: Normal range of motion. She exhibits no edema or tenderness.  Neurological: She is alert and oriented to person, place, and time. She has  normal reflexes. No cranial nerve deficit.  Skin: Skin is warm and dry.  Psychiatric: She has a normal mood and affect. Her behavior is normal. Judgment and thought content normal.  Vitals reviewed.   BP 114/73 mmHg  Pulse 83  Temp(Src) 98 F (36.7 C) (Oral)  Ht 5\' 5"  (1.651 m)  Wt 145 lb (65.772 kg)  BMI 24.13 kg/m2       Assessment & Plan:  1. Hypothyroidism, unspecified hypothyroidism type - Thyroid Panel With TSH   Continue all meds Labs pending Health Maintenance reviewed Diet and exercise encouraged RTO 6 months  Jannifer Rodneyhristy Ellayna Hilligoss, FNP

## 2014-09-30 ENCOUNTER — Telehealth: Payer: Self-pay | Admitting: *Deleted

## 2014-09-30 LAB — THYROID PANEL WITH TSH
Free Thyroxine Index: 1.7 (ref 1.2–4.9)
T3 UPTAKE RATIO: 24 % (ref 24–39)
T4, Total: 7.2 ug/dL (ref 4.5–12.0)
TSH: 3.88 u[IU]/mL (ref 0.450–4.500)

## 2014-09-30 NOTE — Telephone Encounter (Signed)
-----   Message from Junie Spencerhristy A Hawks, FNP sent at 09/30/2014  8:56 AM EDT ----- Thyroid levels WNL

## 2014-09-30 NOTE — Progress Notes (Signed)
Lmtcb/ww 4/8 

## 2015-02-13 ENCOUNTER — Encounter: Payer: Self-pay | Admitting: Family Medicine

## 2015-02-13 ENCOUNTER — Telehealth: Payer: Self-pay | Admitting: Family

## 2015-02-13 ENCOUNTER — Ambulatory Visit (INDEPENDENT_AMBULATORY_CARE_PROVIDER_SITE_OTHER): Payer: 59 | Admitting: Family Medicine

## 2015-02-13 VITALS — BP 132/83 | HR 72 | Temp 97.8°F | Ht 65.0 in | Wt 153.2 lb

## 2015-02-13 DIAGNOSIS — I10 Essential (primary) hypertension: Secondary | ICD-10-CM

## 2015-02-13 DIAGNOSIS — F329 Major depressive disorder, single episode, unspecified: Secondary | ICD-10-CM

## 2015-02-13 DIAGNOSIS — R0600 Dyspnea, unspecified: Secondary | ICD-10-CM

## 2015-02-13 DIAGNOSIS — R002 Palpitations: Secondary | ICD-10-CM

## 2015-02-13 DIAGNOSIS — Z720 Tobacco use: Secondary | ICD-10-CM

## 2015-02-13 DIAGNOSIS — E039 Hypothyroidism, unspecified: Secondary | ICD-10-CM

## 2015-02-13 DIAGNOSIS — F419 Anxiety disorder, unspecified: Secondary | ICD-10-CM

## 2015-02-13 DIAGNOSIS — R609 Edema, unspecified: Secondary | ICD-10-CM | POA: Diagnosis not present

## 2015-02-13 DIAGNOSIS — I4891 Unspecified atrial fibrillation: Secondary | ICD-10-CM | POA: Diagnosis not present

## 2015-02-13 DIAGNOSIS — F32A Depression, unspecified: Secondary | ICD-10-CM

## 2015-02-13 MED ORDER — DULOXETINE HCL 30 MG PO CPEP: 30.0000 mg | ORAL_CAPSULE | Freq: Every day | ORAL | Status: DC

## 2015-02-13 NOTE — Progress Notes (Addendum)
Subjective:  Patient ID: Connie Schultz, female    DOB: [birth year]  Age: 54 y.o. MRN: [id number]  CC: Leg Swelling   HPI Connie Schultz presents for always nervous. Swelling, light headed, weak all for 3 days. Heart getting off beat. Denies chest pain During the last 3 days she's noticed an allover weakness and tiredness. She's been lightheaded but not having vertigo she has had some mild all over headache. She has had a dry mouth for quite some time. She says that her eyes looked yellow at home. Her son has hepatitis and she is worried about that possibility. She has been diagnosed with atrial fibrillation but has never been on blood thinners. She is currently swelling and dyspneic but does not have any chest pain. She does have some left shoulder pain which is nonexertional. Actually perhaps a little bit worse at rest. Somewhat related to movement of the shoulder. Not related temporally to the other symptoms as far his shortness of breath and lightheadedness. It has actually been there longer. She says she's being flushed and she's also had some tingling in the feet and hands. She suffers from anxiety and had to take an extra clonazepam today. She is tearful and worried about what these symptoms mean. Of note is that she's been diagnosed with atrial fibrillation. She does not take a blood thinner for an unknown reason. History Connie Schultz has a past medical history of Palpitations; HTN (hypertension); Edema; Anxiety; Tobacco abuse; and Scoliosis.   She has past surgical history that includes Tonsillectomy and adenoidectomy.   Her family history includes COPD in her father.She reports that she has been smoking.  She has never used smokeless tobacco. She reports that she does not drink alcohol or use illicit drugs.  Outpatient Prescriptions Prior to Visit  Medication Sig Dispense Refill  . Ascorbic Acid (VITAMIN C) 500 MG tablet Take 500 mg by mouth daily.      . Aspirin-Salicylamide-Caffeine (BC  HEADACHE POWDER PO) As needed.     . calcium carbonate (OS-CAL) 600 MG TABS Take 600 mg by mouth daily.      . clonazePAM (KLONOPIN) 1 MG tablet Take 1 tablet (1 mg total) by mouth at bedtime. 30 tablet 3  . cyclobenzaprine (FLEXERIL) 10 MG tablet Take 10 mg by mouth 3 (three) times daily as needed for muscle spasms.    Marland Kitchen levothyroxine (SYNTHROID, LEVOTHROID) 50 MCG tablet Take 1 tablet (50 mcg total) by mouth daily. 90 tablet 3  . metoprolol succinate (TOPROL-XL) 25 MG 24 hr tablet Take 1 tablet (25 mg total) by mouth daily. 90 tablet 3  . Omega-3 Fatty Acids (FISH OIL) 1200 MG CAPS Take by mouth 2 (two) times daily.      Marland Kitchen PARoxetine (PAXIL) 40 MG tablet Take 1 tablet (40 mg total) by mouth every morning. 90 tablet 3  . DICLOFENAC POTASSIUM PO Take 50 mg by mouth. Take one tablet two to three times daily with food     No facility-administered medications prior to visit.    ROS Review of Systems  Constitutional: Positive for fatigue. Negative for fever, chills, diaphoresis, appetite change and unexpected weight change.  HENT: Negative for congestion, ear pain, hearing loss, postnasal drip, rhinorrhea, sneezing, sore throat and trouble swallowing.   Eyes: Negative for pain.  Respiratory: Positive for shortness of breath. Negative for cough and chest tightness.   Cardiovascular: Positive for palpitations and leg swelling. Negative for chest pain.  Gastrointestinal: Negative for nausea, vomiting, abdominal pain, diarrhea and  constipation.  Genitourinary: Negative for dysuria, frequency and menstrual problem.  Musculoskeletal: Positive for arthralgias. Negative for joint swelling.  Skin: Positive for color change. Negative for rash.  Neurological: Positive for light-headedness and numbness (finger tips). Negative for weakness and headaches.  Psychiatric/Behavioral: Negative for dysphoric mood and agitation.    Objective:  BP 132/83 mmHg  Pulse 72  Temp(Src) 97.8 F (36.6 C) (Oral)  Ht   (1.651 m)  Wt 153 lb 3.2 oz (69.491 kg)  BMI 25.49 kg/m2  BP Readings from Last 3 Encounters:  02/13/15 132/83  09/29/14 114/73  09/22/14 116/77    Wt Readings from Last 3 Encounters:  02/13/15 153 lb 3.2 oz (69.491 kg)  09/29/14 145 lb (65.772 kg)  09/22/14 148 lb 9.6 oz (67.405 kg)     Physical Exam  Constitutional: She is oriented to person, place, and time. She appears well-developed and well-nourished. No distress.  HENT:  Head: Normocephalic and atraumatic.  Right Ear: External ear normal.  Left Ear: External ear normal.  Nose: Nose normal.  Mouth/Throat: Oropharynx is clear and moist.  Eyes: Conjunctivae and EOM are normal. Pupils are equal, round, and reactive to light.  Neck: Normal range of motion. Neck supple. No thyromegaly present.  Cardiovascular: Normal rate, regular rhythm and normal heart sounds.   No murmur heard. Pulmonary/Chest: Effort normal and breath sounds normal. No respiratory distress. She has no wheezes. She has no rales.  Abdominal: Soft. Bowel sounds are normal. She exhibits no distension. There is no tenderness.  Lymphadenopathy:    She has no cervical adenopathy.  Neurological: She is alert and oriented to person, place, and time. She has normal reflexes.  Skin: Skin is warm and dry.  Psychiatric: She has a normal mood and affect. Her behavior is normal. Judgment and thought content normal.    No results found for: HGBA1C  Lab Results  Component Value Date   TSH 3.880 09/29/2014    US Renal  12/21/2013   CLINICAL DATA:  Evaluate right renal cyst.  EXAM: RENAL/URINARY TRACT ULTRASOUND COMPLETE  COMPARISON:  Lumbar spine MRI demonstrating the presumed right renal cyst dated December 02, 2013.  FINDINGS: Right Kidney:  Length: 11.3 cm. Echogenicity within normal limits. There is a simple appearing midpole cyst measuring 2.4 x 2.2 cm. There is minimal splitting of the central echo complex which was not demonstrated on the previous MRI.  Left  Kidney:  Length: 12.5 cm. Echogenicity within normal limits. No mass or hydronephrosis visualized.  Bladder:  Appears normal for degree of bladder distention. Bilateral ureteral jets are demonstrated.  IMPRESSION: 1. There is a simple appearing midpole cyst in the right kidney. There is minimal hydronephrosis. 2. The left kidney and urinary bladder are normal.   Electronically Signed   By: David  [place]   On: 12/21/2013 17:13    Assessment & Plan:   There are no diagnoses linked to this encounter. I am having Ms. Bonnell maintain her Fish Oil, vitamin C, calcium carbonate, Aspirin-Salicylamide-Caffeine (BC HEADACHE POWDER PO), DICLOFENAC POTASSIUM PO, cyclobenzaprine, levothyroxine, PARoxetine, clonazePAM, metoprolol succinate, and omeprazole.  Meds ordered this encounter  Medications  . omeprazole (PRILOSEC) 20 MG capsule    Sig: Take 20 mg by mouth daily.    Refill:  3     Follow-up: No Follow-up on file.  Mechele Claude, M.D.

## 2015-02-13 NOTE — Telephone Encounter (Signed)
Appointment given for today with [facility],

## 2015-02-14 ENCOUNTER — Other Ambulatory Visit: Payer: Self-pay | Admitting: Family Medicine

## 2015-02-14 ENCOUNTER — Ambulatory Visit: Payer: 59 | Admitting: Family Medicine

## 2015-02-14 DIAGNOSIS — E039 Hypothyroidism, unspecified: Secondary | ICD-10-CM

## 2015-02-14 LAB — CBC WITH DIFFERENTIAL/PLATELET
BASOS: 1 %
Basophils Absolute: 0.1 10*3/uL (ref 0.0–0.2)
EOS (ABSOLUTE): 0.4 10*3/uL (ref 0.0–0.4)
EOS: 4 %
HEMATOCRIT: 41.9 % (ref 34.0–46.6)
HEMOGLOBIN: 13.6 g/dL (ref 11.1–15.9)
IMMATURE GRANULOCYTES: 0 %
Immature Grans (Abs): 0 10*3/uL (ref 0.0–0.1)
Lymphocytes Absolute: 2.5 10*3/uL (ref 0.7–3.1)
Lymphs: 26 %
MCH: 28.6 pg (ref 26.6–33.0)
MCHC: 32.5 g/dL (ref 31.5–35.7)
MCV: 88 fL (ref 79–97)
MONOCYTES: 6 %
MONOS ABS: 0.6 10*3/uL (ref 0.1–0.9)
NEUTROS PCT: 63 %
Neutrophils Absolute: 6.2 10*3/uL (ref 1.4–7.0)
Platelets: 223 10*3/uL (ref 150–379)
RBC: 4.75 x10E6/uL (ref 3.77–5.28)
RDW: 14.7 % (ref 12.3–15.4)
WBC: 9.8 10*3/uL (ref 3.4–10.8)

## 2015-02-14 LAB — CMP14+EGFR
A/G RATIO: 1.5 (ref 1.1–2.5)
ALK PHOS: 76 IU/L (ref 39–117)
ALT: 11 IU/L (ref 0–32)
AST: 17 IU/L (ref 0–40)
Albumin: 3.8 g/dL (ref 3.5–5.5)
BUN / CREAT RATIO: 11 (ref 9–23)
BUN: 8 mg/dL (ref 6–24)
Bilirubin Total: <0.2 mg/dL (ref 0.0–1.2)
CO2: 27 mmol/L (ref 18–29)
Calcium: 8.9 mg/dL (ref 8.7–10.2)
Chloride: 101 mmol/L (ref 97–108)
Creatinine, Ser: 0.76 mg/dL (ref 0.57–1.00)
GFR calc Af Amer: 104 mL/min/{1.73_m2} (ref >59)
GFR calc non Af Amer: 90 mL/min/{1.73_m2} (ref >59)
GLOBULIN, TOTAL: 2.5 g/dL (ref 1.5–4.5)
Glucose: 88 mg/dL (ref 65–99)
POTASSIUM: 4.3 mmol/L (ref 3.5–5.2)
SODIUM: 143 mmol/L (ref 134–144)
Total Protein: 6.3 g/dL (ref 6.0–8.5)

## 2015-02-14 LAB — D-DIMER, QUANTITATIVE (NOT AT ARMC): D-DIMER: 0.52 mg{FEU}/L — AB (ref 0.00–0.49)

## 2015-02-14 LAB — TSH: TSH: 5.66 u[IU]/mL — ABNORMAL HIGH (ref 0.450–4.500)

## 2015-02-14 LAB — T4, FREE: Free T4: 0.87 ng/dL (ref 0.82–1.77)

## 2015-02-14 LAB — BRAIN NATRIURETIC PEPTIDE: BNP: 88.4 pg/mL (ref 0.0–100.0)

## 2015-02-14 MED ORDER — LEVOTHYROXINE SODIUM 75 MCG PO TABS: 75.0000 ug | ORAL_TABLET | Freq: Every day | ORAL | Status: DC

## 2015-02-15 ENCOUNTER — Telehealth: Payer: Self-pay | Admitting: Family Medicine

## 2015-02-15 NOTE — Progress Notes (Signed)
PATIENT AWARE AND UNDERSTANDS.

## 2015-02-20 ENCOUNTER — Other Ambulatory Visit: Payer: Self-pay | Admitting: *Deleted

## 2015-02-20 ENCOUNTER — Ambulatory Visit (INDEPENDENT_AMBULATORY_CARE_PROVIDER_SITE_OTHER): Payer: 59 | Admitting: *Deleted

## 2015-02-20 DIAGNOSIS — R9431 Abnormal electrocardiogram [ECG] [EKG]: Secondary | ICD-10-CM

## 2015-02-20 NOTE — Progress Notes (Signed)
Pt had abnormal holter monitor Per Dr Darlyn Read, order 30 day event monitor

## 2015-02-20 NOTE — Patient Instructions (Signed)
Cardiac Event Monitoring A cardiac event monitor is a small recording device used to help detect abnormal heart rhythms (arrhythmias). The monitor is used to record heart rhythm when noticeable symptoms such as the following occur:  Fast heartbeats (palpitations), such as heart racing or fluttering.  Dizziness.  Fainting or light-headedness.  Unexplained weakness. The monitor is wired to two electrodes placed on your chest. Electrodes are flat, sticky disks that attach to your skin. The monitor can be worn for up to 30 days. You will wear the monitor at all times, except when bathing.  HOW TO USE YOUR CARDIAC EVENT MONITOR A technician will prepare your chest for the electrode placement. The technician will show you how to place the electrodes, how to work the monitor, and how to replace the batteries. Take time to practice using the monitor before you leave the office. Make sure you understand how to send the information from the monitor to your health care provider. This requires a telephone with a landline, not a cell phone. You need to:  Wear your monitor at all times, except when you are in water:  Do not get the monitor wet.  Take the monitor off when bathing. Do not swim or use a hot tub with it on.  Keep your skin clean. Do not put body lotion or moisturizer on your chest.  Change the electrodes daily or any time they stop sticking to your skin. You might need to use tape to keep them on.  It is possible that your skin under the electrodes could become irritated. To keep this from happening, try to put the electrodes in slightly different places on your chest. However, they must remain in the area under your left breast and in the upper right section of your chest.  Make sure the monitor is safely clipped to your clothing or in a location close to your body that your health care provider recommends.  Press the button to record when you feel symptoms of heart trouble, such as  dizziness, weakness, light-headedness, palpitations, thumping, shortness of breath, unexplained weakness, or a fluttering or racing heart. The monitor is always on and records what happened slightly before you pressed the button, so do not worry about being too late to get good information.  Keep a diary of your activities, such as walking, doing chores, and taking medicine. It is especially important to note what you were doing when you pushed the button to record your symptoms. This will help your health care provider determine what might be contributing to your symptoms. The information stored in your monitor will be reviewed by your health care provider alongside your diary entries.  Send the recorded information as recommended by your health care provider. It is important to understand that it will take some time for your health care provider to process the results.  Change the batteries as recommended by your health care provider. SEEK IMMEDIATE MEDICAL CARE IF:   You have chest pain.  You have extreme difficulty breathing or shortness of breath.  You develop a very fast heartbeat that persists.  You develop dizziness that does not go away.  You faint or constantly feel you are about to faint. Document Released: 03/19/2008 Document Revised: 10/25/2013 Document Reviewed: 12/07/2012 [employer] Patient Information 2015 [employer]. This information is not intended to replace advice given to you by your health care provider. Make sure you discuss any questions you have with your health care provider.  

## 2015-02-20 NOTE — Progress Notes (Signed)
KOH place on patient and instructions given. Patient verbalizes understanding. Patient is to remove device on September 28th and mail back.

## 2015-02-21 ENCOUNTER — Telehealth: Payer: Self-pay

## 2015-02-21 NOTE — Telephone Encounter (Signed)
Insurance prior authorized  Duloxetine HCL until 02/16/16  [place] [id number]

## 2015-02-26 IMAGING — CR DG ABDOMEN 1V
1 series · 1 of 1 positions shown · non-contrast
Comparison: None.

CLINICAL DATA: Abdominal pain

EXAM:
ABDOMEN - 1 VIEW

[view not recorded]
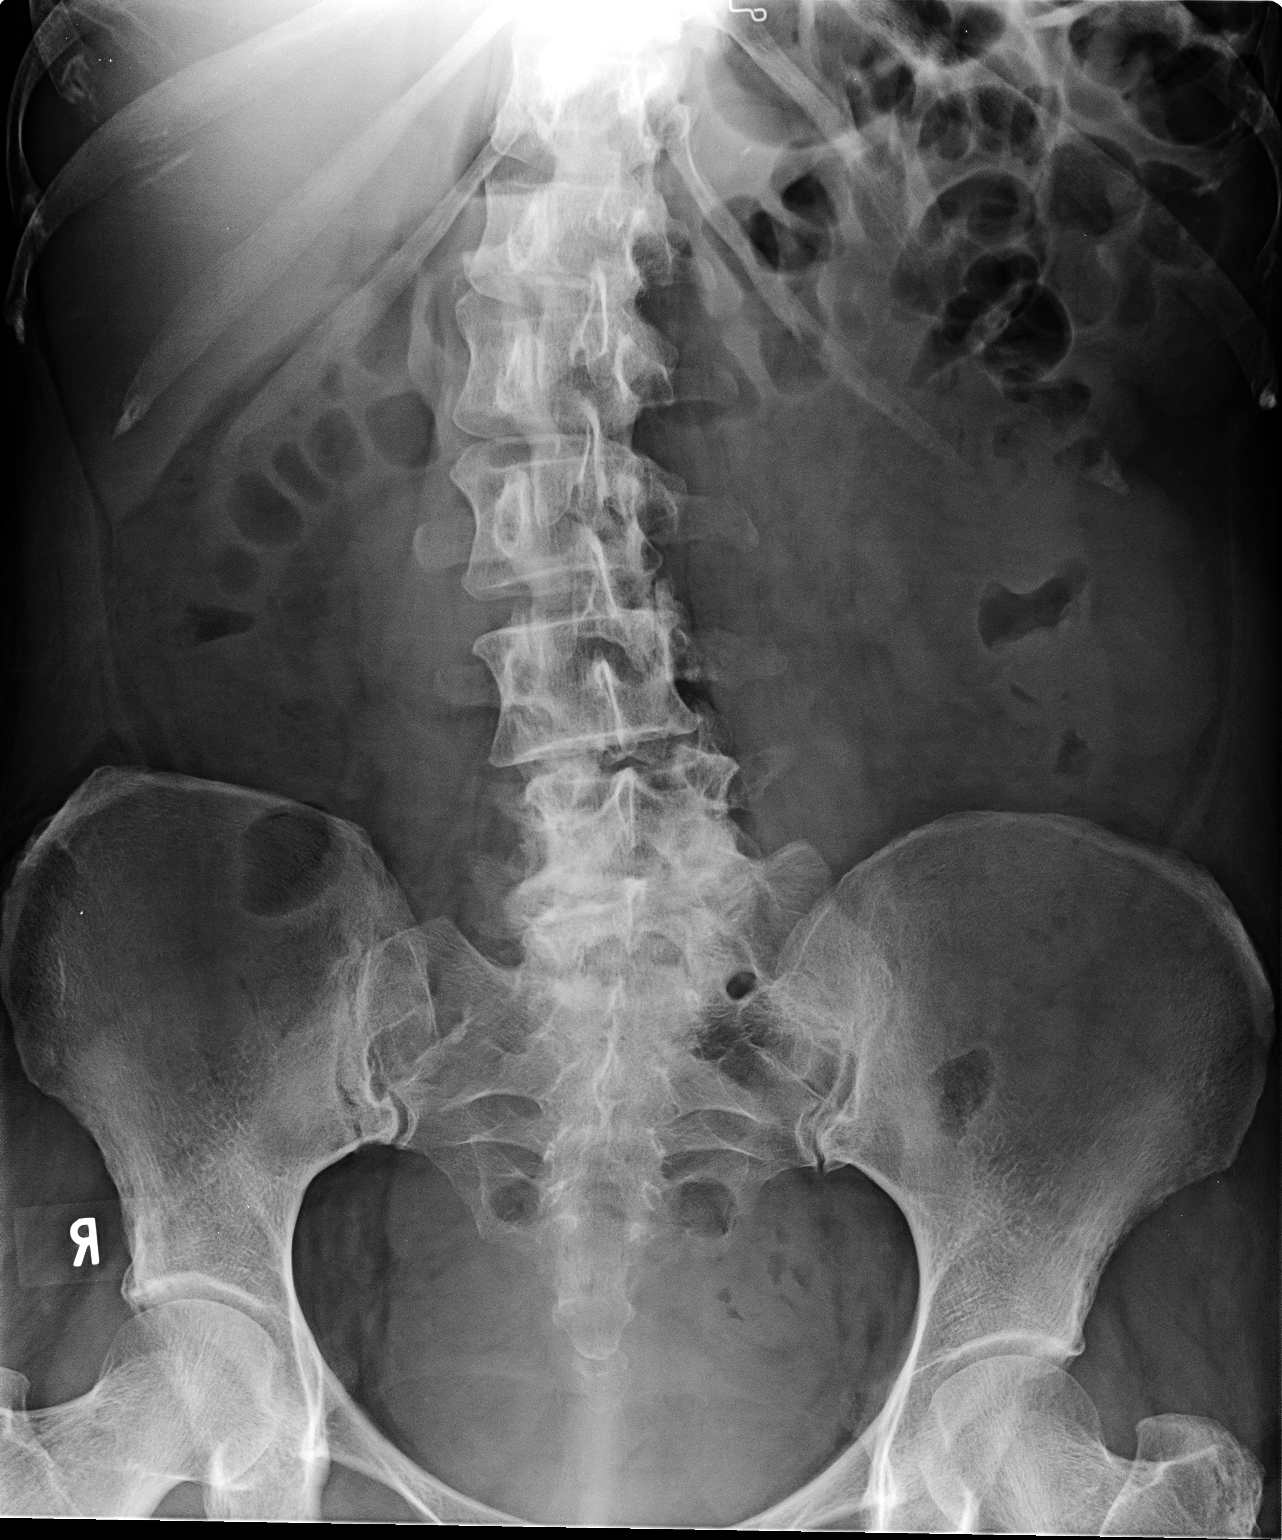

[1 of 1 positions shown; findings below may reference images not displayed]

FINDINGS: Scattered large and small bowel gas is noted. A mild scoliosis of
the lumbar spine concave to the left is noted. The bony structures
are otherwise within normal limits. No free air is seen.
IMPRESSION: No acute abnormality noted.

## 2015-03-07 ENCOUNTER — Telehealth: Payer: Self-pay | Admitting: Family Medicine

## 2015-03-18 ENCOUNTER — Ambulatory Visit: Payer: 59 | Admitting: Family Medicine

## 2015-04-03 ENCOUNTER — Encounter: Payer: 59 | Admitting: Family

## 2015-04-07 ENCOUNTER — Encounter: Payer: 59 | Admitting: Family

## 2015-04-11 ENCOUNTER — Ambulatory Visit (INDEPENDENT_AMBULATORY_CARE_PROVIDER_SITE_OTHER): Payer: 59 | Admitting: Family Medicine

## 2015-04-11 ENCOUNTER — Encounter: Payer: Self-pay | Admitting: Family Medicine

## 2015-04-11 VITALS — BP 135/80 | HR 79 | Temp 98.3°F | Ht 65.0 in | Wt 150.4 lb

## 2015-04-11 DIAGNOSIS — I4891 Unspecified atrial fibrillation: Secondary | ICD-10-CM

## 2015-04-11 DIAGNOSIS — E039 Hypothyroidism, unspecified: Secondary | ICD-10-CM | POA: Diagnosis not present

## 2015-04-11 DIAGNOSIS — I10 Essential (primary) hypertension: Secondary | ICD-10-CM | POA: Diagnosis not present

## 2015-04-11 DIAGNOSIS — I208 Other forms of angina pectoris: Secondary | ICD-10-CM

## 2015-04-11 DIAGNOSIS — F329 Major depressive disorder, single episode, unspecified: Secondary | ICD-10-CM | POA: Diagnosis not present

## 2015-04-11 DIAGNOSIS — F32A Depression, unspecified: Secondary | ICD-10-CM

## 2015-04-11 MED ORDER — DULOXETINE HCL 30 MG PO CPEP: 90.0000 mg | ORAL_CAPSULE | Freq: Every day | ORAL | Status: DC

## 2015-04-11 MED ORDER — NITROGLYCERIN 0.4 MG SL SUBL: 0.4000 mg | SUBLINGUAL_TABLET | SUBLINGUAL | Status: AC | PRN

## 2015-04-11 NOTE — Progress Notes (Signed)
Subjective:  Patient ID: Connie Schultz, female    DOB: 11/06/1960  Age: 54 y.o. MRN: [id number]  CC: Hypothyroidism and Depression   HPI Connie Schultz presents for Patient presents for follow-up on  thyroid. She has a history of hypothyroidism for many years. It has been stable recently. Pt. denies any change in  voice, loss of hair, heat or cold intolerance. Energy level has been adequate to good. She denies constipation and diarrhea. No myxedema. Medication is as noted below. Verified that pt is taking it daily on an empty stomach. Well tolerated.  Pt. hasis also been taking duloxetine for depression. Her symptoms are worsening. Tearful, withdrawn. Can't face work. Requests time off. Has an appt. To see a counselor in 2 days.. For the last week she has had significant nausea. She did not have that initially while taking the Cymbalta. She has been taking 2 capsules of the 30 mg daily. She ran out today.  Patient reports 10 minute episodes daily of left shoulder pain and numbness running down to the hand. This is loosely temporally related to palpitations. She says her heart does run away with her at times still. In spite of the metoprolol. Additionally she denies any chest pain or shortness of breath. She does continue to smoke. These episodes are loosely associated with activity and that they are less when she is laying down at night and in the evening and increased through the day while she's at work.  History Connie Schultz has a past medical history of Palpitations; HTN (hypertension); Edema; Anxiety; Tobacco abuse; and Scoliosis.   She has past surgical history that includes Tonsillectomy and adenoidectomy.   Her family history includes COPD in her father.She reports that she has been smoking.  She has never used smokeless tobacco. She reports that she does not drink alcohol or use illicit drugs.  Outpatient Prescriptions Prior to Visit  Medication Sig Dispense Refill  . Ascorbic Acid (VITAMIN  C) 500 MG tablet Take 500 mg by mouth daily.      . calcium carbonate (OS-CAL) 600 MG TABS Take 600 mg by mouth daily.      . clonazePAM (KLONOPIN) 1 MG tablet Take 1 tablet (1 mg total) by mouth at bedtime. 30 tablet 3  . levothyroxine (SYNTHROID, LEVOTHROID) 75 MCG tablet Take 1 tablet (75 mcg total) by mouth daily. 90 tablet 1  . metoprolol succinate (TOPROL-XL) 25 MG 24 hr tablet Take 1 tablet (25 mg total) by mouth daily. 90 tablet 3  . omeprazole (PRILOSEC) 20 MG capsule Take 20 mg by mouth daily.  3  . DULoxetine (CYMBALTA) 30 MG capsule Take 1 capsule (30 mg total) by mouth daily. For one week then two daily. Take with a full stomach at suppertime 60 capsule 0  . Omega-3 Fatty Acids (FISH OIL) 1200 MG CAPS Take by mouth 2 (two) times daily.       No facility-administered medications prior to visit.    ROS Review of Systems  Constitutional: Negative for fever, chills, diaphoresis, appetite change, fatigue and unexpected weight change.  HENT: Negative for congestion, ear pain, hearing loss, postnasal drip, rhinorrhea, sneezing, sore throat and trouble swallowing.   Eyes: Negative for pain.  Respiratory: Negative for cough, chest tightness and shortness of breath.   Cardiovascular: Negative for chest pain and palpitations.  Gastrointestinal: Negative for nausea, vomiting, abdominal pain, diarrhea and constipation.  Genitourinary: Negative for dysuria, frequency and menstrual problem.  Musculoskeletal: Negative for joint swelling and arthralgias.  Skin: Negative  for rash.  Neurological: Positive for light-headedness and numbness (left arm at the wrist and hand. Nondermatomal). Negative for weakness and headaches.  Psychiatric/Behavioral: Positive for confusion, sleep disturbance, dysphoric mood, decreased concentration and agitation. Negative for suicidal ideas and self-injury. The patient is nervous/anxious. The patient is not hyperactive.     Objective:  BP 135/80 mmHg  Pulse 79   Temp(Src) 98.3 F (36.8 C) (Oral)  Ht  (1.651 m)  Wt 150 lb 6.4 oz (68.221 kg)  BMI 25.03 kg/m2  BP Readings from Last 3 Encounters:  04/11/15 135/80  02/13/15 132/83  09/29/14 114/73    Wt Readings from Last 3 Encounters:  04/11/15 150 lb 6.4 oz (68.221 kg)  02/13/15 153 lb 3.2 oz (69.491 kg)  09/29/14 145 lb (65.772 kg)     Physical Exam  Constitutional: She is oriented to person, place, and time. She appears well-developed and well-nourished. She appears distressed (distraught).  HENT:  Head: Normocephalic and atraumatic.  Right Ear: External ear normal.  Left Ear: External ear normal.  Nose: Nose normal.  Mouth/Throat: Oropharynx is clear and moist.  Eyes: Conjunctivae and EOM are normal. Pupils are equal, round, and reactive to light.  Neck: Normal range of motion. Neck supple. No thyromegaly present.  Cardiovascular: Normal rate, regular rhythm and normal heart sounds.   No murmur heard. Pulmonary/Chest: Effort normal and breath sounds normal. No respiratory distress. She has no wheezes. She has no rales.  Abdominal: Soft. Bowel sounds are normal. She exhibits no distension. There is no tenderness.  Lymphadenopathy:    She has no cervical adenopathy.  Neurological: She is alert and oriented to person, place, and time. She has normal reflexes.  Skin: Skin is warm and dry.  Psychiatric: She has a normal mood and affect. Thought content normal.  Tearful and agitated without confusion or behavioral disturbance. Mood is dysphoric affect is labile.    No results found for: HGBA1C  Lab Results  Component Value Date   WBC 9.8 02/13/2015   HCT 41.9 02/13/2015   GLUCOSE 88 02/13/2015   ALT 11 02/13/2015   AST 17 02/13/2015   NA 143 02/13/2015   K 4.3 02/13/2015   CL 101 02/13/2015   CREATININE 0.76 02/13/2015   BUN 8 02/13/2015   CO2 27 02/13/2015   TSH 5.660* 02/13/2015    US Renal  12/21/2013  CLINICAL DATA:  Evaluate right renal cyst. EXAM:  RENAL/URINARY TRACT ULTRASOUND COMPLETE COMPARISON:  Lumbar spine MRI demonstrating the presumed right renal cyst dated December 02, 2013. FINDINGS: Right Kidney: Length: 11.3 cm. Echogenicity within normal limits. There is a simple appearing midpole cyst measuring 2.4 x 2.2 cm. There is minimal splitting of the central echo complex which was not demonstrated on the previous MRI. Left Kidney: Length: 12.5 cm. Echogenicity within normal limits. No mass or hydronephrosis visualized. Bladder: Appears normal for degree of bladder distention. Bilateral ureteral jets are demonstrated. IMPRESSION: 1. There is a simple appearing midpole cyst in the right kidney. There is minimal hydronephrosis. 2. The left kidney and urinary bladder are normal. Electronically Signed   By: David  [place]   On: 12/21/2013 17:13    Assessment & Plan:   Amarie was seen today for hypothyroidism and depression.  Diagnoses and all orders for this visit:  Hypothyroidism, unspecified hypothyroidism type -     TSH -     T4, Free -     T3, Free -     Ambulatory referral to Cardiology  Essential  hypertension -     TSH -     T4, Free -     T3, Free -     Ambulatory referral to Cardiology  Depression -     TSH -     T4, Free -     T3, Free -     Ambulatory referral to Cardiology  Atrial fibrillation, unspecified type (HCC) -     TSH -     T4, Free -     T3, Free -     Ambulatory referral to Cardiology  Anginal equivalent (HCC) -     TSH -     T4, Free -     T3, Free -     Ambulatory referral to Cardiology  Other orders -     DULoxetine (CYMBALTA) 30 MG capsule; Take 3 capsules (90 mg total) by mouth daily. -     nitroGLYCERIN (NITROSTAT) 0.4 MG SL tablet; Place 1 tablet (0.4 mg total) under the tongue every 5 (five) minutes as needed for chest pain (Also for the shoulder and arm pain and palpitations).   I have discontinued Connie Schultz's Fish Oil. I have also changed her DULoxetine. Additionally, I am having her  start on nitroGLYCERIN. Lastly, I am having her maintain her vitamin C, calcium carbonate, clonazePAM, metoprolol succinate, omeprazole, and levothyroxine.  Meds ordered this encounter  Medications  . DULoxetine (CYMBALTA) 30 MG capsule    Sig: Take 3 capsules (90 mg total) by mouth daily.    Dispense:  90 capsule    Refill:  0  . nitroGLYCERIN (NITROSTAT) 0.4 MG SL tablet    Sig: Place 1 tablet (0.4 mg total) under the tongue every 5 (five) minutes as needed for chest pain (Also for the shoulder and arm pain and palpitations).    Dispense:  50 tablet    Refill:  3     Follow-up: Return in about 2 weeks (around 04/25/2015) for Depression.  Mechele Claude, M.D.

## 2015-04-25 ENCOUNTER — Ambulatory Visit: Payer: 59 | Admitting: Family Medicine

## 2015-04-26 ENCOUNTER — Encounter: Payer: Self-pay | Admitting: Family Medicine

## 2015-04-27 ENCOUNTER — Ambulatory Visit (INDEPENDENT_AMBULATORY_CARE_PROVIDER_SITE_OTHER): Payer: 59 | Admitting: Cardiology

## 2015-04-27 ENCOUNTER — Encounter: Payer: Self-pay | Admitting: Cardiology

## 2015-04-27 ENCOUNTER — Encounter: Payer: Self-pay | Admitting: *Deleted

## 2015-04-27 VITALS — BP 122/73 | HR 76 | Ht 65.0 in | Wt 149.2 lb

## 2015-04-27 DIAGNOSIS — Z720 Tobacco use: Secondary | ICD-10-CM

## 2015-04-27 DIAGNOSIS — I4891 Unspecified atrial fibrillation: Secondary | ICD-10-CM

## 2015-04-27 DIAGNOSIS — R002 Palpitations: Secondary | ICD-10-CM

## 2015-04-27 DIAGNOSIS — R0602 Shortness of breath: Secondary | ICD-10-CM | POA: Diagnosis not present

## 2015-04-27 DIAGNOSIS — I10 Essential (primary) hypertension: Secondary | ICD-10-CM

## 2015-04-27 DIAGNOSIS — R0789 Other chest pain: Secondary | ICD-10-CM | POA: Diagnosis not present

## 2015-04-27 NOTE — Patient Instructions (Addendum)
Your physician recommends that you schedule a follow-up appointment to be determined after test results  Your physician recommends that you continue on your current medications as directed. Please refer to the Current Medication list given to you today.  Your physician has requested that you have a stress echocardiogram. For further information please visit [email or web address]. Please follow instruction sheet as given.  PLEASE HOLD TOPROL XL THE MORNING OF YOUR TEST   Thank you for choosing [facility]!!

## 2015-04-27 NOTE — Progress Notes (Signed)
Cardiology Office Note  Date: 04/27/2015   ID: Connie Schultz, DOB 12/12/1960, MRN [id number]  PCP: Mechele ClaudeSTACKS,WARREN, MD  Consulting Cardiologist: Nona DellSamuel McDowell, MD   Chief Complaint  Patient presents with  . Palpitations    History of Present Illness: Connie Schultz is a 54 y.o. female referred for cardiology consultation by Dr. Darlyn ReadStacks. I reviewed her history and updated the chart. She describes a long-standing history of intermittent palpitations, no specific arrhythmias have been documented over time based on review of her chart. She states she has been under a lot of stress since the death of her sister last year from cervical cancer. She has been having palpitations in this setting, no dizziness or syncope however. Recently, she has been experiencing intermittent left shoulder and upper chest discomfort described as a dull ache, tingling also in her left hand. This is not necessarily precipitated by exercise or stress. She reports fatigue and dyspnea on exertion as well with typical activities.  Recent 24 hour Holter monitor done in September, ordered by Dr. Darlyn ReadStacks, showed sinus rhythm with heart rate ranged from 57 bpm to 120 bpm, rare atrial and ventricular ectopy, no pauses. No atrial fibrillation documented. I reviewed the available strips.  She was seen by Dr. Antoine PocheHochrein back in 2012 for evaluation of palpitations. Evaluation at that time included an echocardiogram, GXT, and also a cardiac event monitor. These results were reviewed today and outlined below. No major abnormalities were uncovered.  Follow-up ECG today shows normal sinus rhythm.  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 No regular exercise regimen. He is a fairly long-standing history of tobacco use, has not been able to quit. No obvious history of premature CAD in her family. She does not report any personal history of diabetes mellitus.  Past Medical History  Diagnosis Date  . Palpitations   . Essential  hypertension   . Hypothyroidism   . Anxiety   . Scoliosis   . Depression     Past Surgical History  Procedure Laterality Date  . Tonsillectomy and adenoidectomy      Current Outpatient Prescriptions  Medication Sig Dispense Refill  . Ascorbic Acid (VITAMIN C) 500 MG tablet Take 500 mg by mouth daily.      . calcium carbonate (OS-CAL) 600 MG TABS Take 600 mg by mouth daily.      . clonazePAM (KLONOPIN) 1 MG tablet Take 1 tablet (1 mg total) by mouth at bedtime. 30 tablet 3  . DULoxetine (CYMBALTA) 30 MG capsule Take 3 capsules (90 mg total) by mouth daily. 90 capsule 0  . levothyroxine (SYNTHROID, LEVOTHROID) 75 MCG tablet Take 1 tablet (75 mcg total) by mouth daily. 90 tablet 1  . metoprolol succinate (TOPROL-XL) 25 MG 24 hr tablet Take 1 tablet (25 mg total) by mouth daily. 90 tablet 3  . omeprazole (PRILOSEC) 20 MG capsule Take 20 mg by mouth daily.  3  . nitroGLYCERIN (NITROSTAT) 0.4 MG SL tablet Place 1 tablet (0.4 mg total) under the tongue every 5 (five) minutes as needed for chest pain (Also for the shoulder and arm pain and palpitations). (Patient not taking: Reported on 04/27/2015) 50 tablet 3   No current facility-administered medications for this visit.    Allergies:  Sulfa antibiotics   Social History: The patient  reports that she has been smoking Cigarettes.  She has been smoking about 1.00 pack per day. She has never used smokeless tobacco. She reports that she does not drink alcohol or use illicit  drugs.   Family History: The patient's family history includes COPD in her father.   ROS:  Please see the history of present illness. Otherwise, complete review of systems is positive for emotional stress and anxiety.  All other systems are reviewed and negative.   Physical Exam: VS:  BP 122/73 mmHg  Pulse 76  Ht  (1.651 m)  Wt 149 lb 3.2 oz (67.677 kg)  BMI 24.83 kg/m2  SpO2 97%, BMI Body mass index is 24.83 kg/(m^2).  Wt Readings from Last 3 Encounters:    04/27/15 149 lb 3.2 oz (67.677 kg)  04/11/15 150 lb 6.4 oz (68.221 kg)  02/13/15 153 lb 3.2 oz (69.491 kg)     General: Patient appears comfortable at rest. HEENT: Conjunctiva and lids normal, oropharynx clear. Neck: Supple, no elevated JVP or carotid bruits, no thyromegaly. Lungs: Clear to auscultation, nonlabored breathing at rest. Cardiac: Regular rate and rhythm, no S3, soft systolic murmur, no pericardial rub. Abdomen: Soft, nontender, bowel sounds present, no guarding or rebound. Extremities: No pitting edema, distal pulses 2+. Skin: Warm and dry. Musculoskeletal: No kyphosis. Neuropsychiatric: Alert and oriented x3, affect grossly appropriate.   ECG: ECG is ordered today.   Recent Labwork: 02/13/2015: ALT 11; AST 17; BNP 88.4; BUN 8; Creatinine, Ser 0.76; Potassium 4.3; Sodium 143; TSH 5.660*, BNP 88, hemoglobin 13.6, platelets 223  Other Studies Reviewed Today:  Echocardiogram 11/02/2010 [facility]) reported mild LVH with LVEF 60-65%, normal diastolic filling pattern, mild mitral regurgitation, trace aortic regurgitation.  GXT 11/02/2010 [facility]) was negative for ischemia or inducible arrhythmia at maximum workload of 10.1 METS. Hypertensive response noted.   Cardiac event monitor 12/12/2010 showed sinus rhythm and sinus tachycardia with rare PVC.   Assessment and Plan:  1. Atypical chest pain and left shoulder discomfort as outlined, no definite precipitant. Also baseline fatigue and dyspnea exertion. Cardiac risk factors include hypertension and tobacco use. She had a reassuring cardiac evaluation approximately 4 years ago, no interval testing. ECG today is normal. I recommended that we proceed with an exercise echocardiogram (hold Toprol-XL) for follow-up evaluation.  2. Long-standing history of palpitations. Objective monitoring over time his showed atrial and ventricular ectopy, no definite arrhythmias. Agree with use of beta blocker for now, she might even be able to  tolerate higher dose if her symptoms worsen. No clear indication for repeat monitoring now.  3. Ongoing tobacco use. Smoking cessation would be of benefit, she has had difficulty quitting over time however.  4. Essential hypertension, blood pressure is normal today.  5. History of anxiety and depression. Complicated by situational stress. This may also be contributing to her symptoms.  Current medicines were reviewed with the patient today.   Orders Placed This Encounter  Procedures  . EKG 12-Lead  . Echo stress    Disposition: Call with results.   Signed, Jonelle Sidle, MD, [facility] 04/27/2015 1:56 PM    [facility] [address] [place], [place], [place] [address] Phone: [phone]; Fax: [phone]

## 2015-05-04 ENCOUNTER — Other Ambulatory Visit: Payer: Self-pay | Admitting: *Deleted

## 2015-05-04 DIAGNOSIS — R079 Chest pain, unspecified: Secondary | ICD-10-CM

## 2015-05-05 ENCOUNTER — Inpatient Hospital Stay (HOSPITAL_COMMUNITY)
Admission: RE | Admit: 2015-05-05 | Discharge: 2015-05-05 | Disposition: A | Discharge status: Home / Self Care | Payer: 59 | Source: Ambulatory Visit | Attending: Cardiology | Admitting: Cardiology

## 2015-05-05 ENCOUNTER — Ambulatory Visit (HOSPITAL_COMMUNITY)
Admission: RE | Admit: 2015-05-05 | Discharge: 2015-05-05 | Disposition: A | Discharge status: Home / Self Care | Payer: 59 | Source: Ambulatory Visit | Attending: Cardiology | Admitting: Cardiology

## 2015-05-05 DIAGNOSIS — R079 Chest pain, unspecified: Secondary | ICD-10-CM | POA: Diagnosis not present

## 2015-05-05 LAB — EXERCISE TOLERANCE TEST
CHL CUP MPHR: 168 {beats}/min
CHL RATE OF PERCEIVED EXERTION: 14
CSEPEW: 10.1 METS
CSEPHR: 96 %
CSEPPHR: 162 {beats}/min
Exercise duration (min): 8 min
Exercise duration (sec): 0 s
Rest HR: 66 {beats}/min

## 2015-05-08 ENCOUNTER — Telehealth: Payer: Self-pay | Admitting: *Deleted

## 2015-05-08 NOTE — Telephone Encounter (Signed)
Patient informed. 

## 2015-05-08 NOTE — Telephone Encounter (Signed)
-----   Message from Jonelle SidleSamuel G McDowell, MD sent at 05/05/2015  1:49 PM EST ----- Reviewed. Please let her know that the stress test looked good overall, low risk for major adverse cardiac events at this time. Keep follow-up with PCP.

## 2015-05-26 ENCOUNTER — Encounter: Payer: Self-pay | Admitting: Family

## 2015-05-26 ENCOUNTER — Ambulatory Visit (INDEPENDENT_AMBULATORY_CARE_PROVIDER_SITE_OTHER): Payer: 59 | Admitting: Family

## 2015-05-26 VITALS — BP 120/82 | HR 88 | Temp 97.5°F | Ht 65.0 in | Wt 151.0 lb

## 2015-05-26 DIAGNOSIS — Z23 Encounter for immunization: Secondary | ICD-10-CM | POA: Diagnosis not present

## 2015-05-26 DIAGNOSIS — R002 Palpitations: Secondary | ICD-10-CM

## 2015-05-26 DIAGNOSIS — I10 Essential (primary) hypertension: Secondary | ICD-10-CM

## 2015-05-26 DIAGNOSIS — Z720 Tobacco use: Secondary | ICD-10-CM

## 2015-05-26 DIAGNOSIS — Z01419 Encounter for gynecological examination (general) (routine) without abnormal findings: Secondary | ICD-10-CM

## 2015-05-26 DIAGNOSIS — F32A Depression, unspecified: Secondary | ICD-10-CM

## 2015-05-26 DIAGNOSIS — K219 Gastro-esophageal reflux disease without esophagitis: Secondary | ICD-10-CM | POA: Insufficient documentation

## 2015-05-26 DIAGNOSIS — Z1159 Encounter for screening for other viral diseases: Secondary | ICD-10-CM

## 2015-05-26 DIAGNOSIS — Z0000 Encounter for general adult medical examination without abnormal findings: Secondary | ICD-10-CM | POA: Diagnosis not present

## 2015-05-26 DIAGNOSIS — E039 Hypothyroidism, unspecified: Secondary | ICD-10-CM

## 2015-05-26 DIAGNOSIS — F329 Major depressive disorder, single episode, unspecified: Secondary | ICD-10-CM

## 2015-05-26 DIAGNOSIS — F419 Anxiety disorder, unspecified: Secondary | ICD-10-CM

## 2015-05-26 MED ORDER — CLONAZEPAM 1 MG PO TABS: 1.0000 mg | ORAL_TABLET | Freq: Every day | ORAL | Status: AC

## 2015-05-26 NOTE — Progress Notes (Signed)
Subjective:    Patient ID: Connie Schultz, female    DOB: 02-25-1961, 54 y.o.   MRN: [id number]  PT presents to the office today for CPE with pap. Gynecologic Exam Associated symptoms include constipation and diarrhea. Pertinent negatives include no headaches.  Hypertension This is a chronic problem. The current episode started more than 1 year ago. The problem has been resolved since onset. The problem is controlled. Associated symptoms include anxiety, palpitations and peripheral edema ("at times"). Pertinent negatives include no headaches, malaise/fatigue or shortness of breath. Risk factors for coronary artery disease include post-menopausal state and sedentary lifestyle. Past treatments include beta blockers. The current treatment provides moderate improvement. Hypertensive end-organ damage includes a thyroid problem. There is no history of kidney disease, CAD/MI, CVA or heart failure. There is no history of sleep apnea.  Thyroid Problem Presents for initial visit. Symptoms include anxiety, constipation, depressed mood, diarrhea and palpitations. Patient reports no dry skin, hair loss or visual change. The symptoms have been worsening. Past treatments include levothyroxine. The treatment provided moderate relief. There is no history of heart failure.  Anxiety Presents for initial visit. Onset was 1 to 6 months ago. The problem has been resolved. Symptoms include depressed mood, nervous/anxious behavior, palpitations and restlessness. Patient reports no excessive worry, insomnia, shortness of breath or suicidal ideas. Symptoms occur occasionally.   Her past medical history is significant for anxiety/panic attacks and depression. Past treatments include benzodiazephines and non-SSRI antidepressants.  Gastroesophageal Reflux She reports no belching, no coughing or no heartburn. This is a chronic problem. The current episode started more than 1 year ago. The problem occurs rarely. The problem has  been resolved. The symptoms are aggravated by certain foods. The treatment provided moderate relief.  Depression      The patient presents with depression.  This is a chronic problem.  The current episode started more than 1 year ago.   The onset quality is gradual.   The problem occurs intermittently.  The problem has been waxing and waning since onset.  Associated symptoms include restlessness.  Associated symptoms include does not have insomnia, no headaches, not sad and no suicidal ideas.  Past treatments include SNRIs - Serotonin and norepinephrine reuptake inhibitors.  Compliance with treatment is good.  Past medical history includes thyroid problem, anxiety and depression.       Review of Systems  Constitutional: Negative.  Negative for malaise/fatigue.  HENT: Negative.   Eyes: Negative.   Respiratory: Negative.  Negative for cough and shortness of breath.   Cardiovascular: Positive for palpitations.  Gastrointestinal: Positive for diarrhea and constipation. Negative for heartburn.  Endocrine: Negative.   Genitourinary: Negative.   Musculoskeletal: Negative.   Neurological: Negative.  Negative for headaches.  Hematological: Negative.   Psychiatric/Behavioral: Positive for depression. Negative for suicidal ideas. The patient is nervous/anxious. The patient does not have insomnia.   All other systems reviewed and are negative.      Objective:   Physical Exam  Constitutional: She is oriented to person, place, and time. She appears well-developed and well-nourished. No distress.  HENT:  Head: Normocephalic and atraumatic.  Right Ear: External ear normal.  Left Ear: External ear normal.  Nose: Nose normal.  Mouth/Throat: Oropharynx is clear and moist.  Eyes: Pupils are equal, round, and reactive to light.  Neck: Normal range of motion. Neck supple. No thyromegaly present.  Cardiovascular: Normal rate, regular rhythm, normal heart sounds and intact distal pulses.   No murmur  heard. Pulmonary/Chest: Effort normal and  breath sounds normal. No respiratory distress. She has no wheezes. Right breast exhibits no inverted nipple, no mass, no nipple discharge, no skin change and no tenderness. Left breast exhibits no inverted nipple, no mass, no nipple discharge, no skin change and no tenderness. Breasts are symmetrical.  Abdominal: Soft. Bowel sounds are normal. She exhibits no distension. There is no tenderness.  Genitourinary: Vagina normal.  Bimanual exam- no adnexal masses or tenderness, ovaries nonpalpable   Cervix parous and pink- No discharge   Musculoskeletal: Normal range of motion. She exhibits no edema or tenderness.  Neurological: She is alert and oriented to person, place, and time. She has normal reflexes. No cranial nerve deficit.  Skin: Skin is warm and dry.  Psychiatric: She has a normal mood and affect. Her behavior is normal. Judgment and thought content normal.  Vitals reviewed.   BP 120/82 mmHg  Pulse 88  Temp(Src) 97.5 F (36.4 C) (Oral)  Ht 5' 5"  (1.651 m)  Wt 151 lb (68.493 kg)  BMI 25.13 kg/m2       Assessment & Plan:  1. Essential hypertension - CMP14+EGFR  2. Hypothyroidism, unspecified hypothyroidism type - CMP14+EGFR - Thyroid Panel With TSH  3. Anxiety - CMP14+EGFR - clonazePAM (KLONOPIN) 1 MG tablet; Take 1 tablet (1 mg total) by mouth at bedtime.  Dispense: 30 tablet; Refill: 3  4. Depression - CMP14+EGFR  5. Tobacco abuse - CMP14+EGFR  6. Palpitations - CMP14+EGFR  7. Gastroesophageal reflux disease, esophagitis presence not specified - CMP14+EGFR  8. Annual physical exam - CMP14+EGFR - Lipid panel - Thyroid Panel With TSH - VITAMIN D 25 Hydroxy (Vit-D Deficiency, Fractures) - Hepatitis C antibody - Pap IG w/ reflex to HPV when ASC-U - Anemia Profile B  9. Encounter for routine gynecological examination - CMP14+EGFR - Pap IG w/ reflex to HPV when ASC-U  10. Need for hepatitis C screening test -  CMP14+EGFR - Hepatitis C antibody   Continue all meds Labs pending Health Maintenance reviewed Diet and exercise encouraged RTO 6 months  Evelina Dun, FNP

## 2015-05-26 NOTE — Patient Instructions (Signed)
Health Maintenance, Female Adopting a healthy lifestyle and getting preventive care can go a long way to promote health and wellness. Talk with your health care provider about what schedule of regular examinations is right for you. This is a good chance for you to check in with your provider about disease prevention and staying healthy. In between checkups, there are plenty of things you can do on your own. Experts have done a lot of research about which lifestyle changes and preventive measures are most likely to keep you healthy. Ask your health care provider for more information. WEIGHT AND DIET  Eat a healthy diet  Be sure to include plenty of vegetables, fruits, low-fat dairy products, and lean protein.  Do not eat a lot of foods high in solid fats, added sugars, or salt.  Get regular exercise. This is one of the most important things you can do for your health.  Most adults should exercise for at least 150 minutes each week. The exercise should increase your heart rate and make you sweat (moderate-intensity exercise).  Most adults should also do strengthening exercises at least twice a week. This is in addition to the moderate-intensity exercise.  Maintain a healthy weight  Body mass index (BMI) is a measurement that can be used to identify possible weight problems. It estimates body fat based on height and weight. Your health care provider can help determine your BMI and help you achieve or maintain a healthy weight.  For females 20 years of age and older:   A BMI below 18.5 is considered underweight.  A BMI of 18.5 to 24.9 is normal.  A BMI of 25 to 29.9 is considered overweight.  A BMI of 30 and above is considered obese.  Watch levels of cholesterol and blood lipids  You should start having your blood tested for lipids and cholesterol at 52 years of age, then have this test every 5 years.  You may need to have your cholesterol levels checked more often if:  Your lipid  or cholesterol levels are high.  You are older than 52 years of age.  You are at high risk for heart disease.  CANCER SCREENING   Lung Cancer  Lung cancer screening is recommended for adults 55-80 years old who are at high risk for lung cancer because of a history of smoking.  A yearly low-dose CT scan of the lungs is recommended for people who:  Currently smoke.  Have quit within the past 15 years.  Have at least a 30-pack-year history of smoking. A pack year is smoking an average of one pack of cigarettes a day for 1 year.  Yearly screening should continue until it has been 15 years since you quit.  Yearly screening should stop if you develop a health problem that would prevent you from having lung cancer treatment.  Breast Cancer  Practice breast self-awareness. This means understanding how your breasts normally appear and feel.  It also means doing regular breast self-exams. Let your health care provider know about any changes, no matter how small.  If you are in your 20s or 30s, you should have a clinical breast exam (CBE) by a health care provider every 1-3 years as part of a regular health exam.  If you are 40 or older, have a CBE every year. Also consider having a breast X-ray (mammogram) every year.  If you have a family history of breast cancer, talk to your health care provider about genetic screening.  If you   are at high risk for breast cancer, talk to your health care provider about having an MRI and a mammogram every year.  Breast cancer gene (BRCA) assessment is recommended for women who have family members with BRCA-related cancers. BRCA-related cancers include:  Breast.  Ovarian.  Tubal.  Peritoneal cancers.  Results of the assessment will determine the need for genetic counseling and BRCA1 and BRCA2 testing. Cervical Cancer Your health care provider may recommend that you be screened regularly for cancer of the pelvic organs (ovaries, uterus, and  vagina). This screening involves a pelvic examination, including checking for microscopic changes to the surface of your cervix (Pap test). You may be encouraged to have this screening done every 3 years, beginning at age 21.  For women ages 30-65, health care providers may recommend pelvic exams and Pap testing every 3 years, or they may recommend the Pap and pelvic exam, combined with testing for human papilloma virus (HPV), every 5 years. Some types of HPV increase your risk of cervical cancer. Testing for HPV may also be done on women of any age with unclear Pap test results.  Other health care providers may not recommend any screening for nonpregnant women who are considered low risk for pelvic cancer and who do not have symptoms. Ask your health care provider if a screening pelvic exam is right for you.  If you have had past treatment for cervical cancer or a condition that could lead to cancer, you need Pap tests and screening for cancer for at least 20 years after your treatment. If Pap tests have been discontinued, your risk factors (such as having a new sexual partner) need to be reassessed to determine if screening should resume. Some women have medical problems that increase the chance of getting cervical cancer. In these cases, your health care provider may recommend more frequent screening and Pap tests. Colorectal Cancer  This type of cancer can be detected and often prevented.  Routine colorectal cancer screening usually begins at 52 years of age and continues through 52 years of age.  Your health care provider may recommend screening at an earlier age if you have risk factors for colon cancer.  Your health care provider may also recommend using home test kits to check for hidden blood in the stool.  A small camera at the end of a tube can be used to examine your colon directly (sigmoidoscopy or colonoscopy). This is done to check for the earliest forms of colorectal  cancer.  Routine screening usually begins at age 50.  Direct examination of the colon should be repeated every 5-10 years through 52 years of age. However, you may need to be screened more often if early forms of precancerous polyps or small growths are found. Skin Cancer  Check your skin from head to toe regularly.  Tell your health care provider about any new moles or changes in moles, especially if there is a change in a mole's shape or color.  Also tell your health care provider if you have a mole that is larger than the size of a pencil eraser.  Always use sunscreen. Apply sunscreen liberally and repeatedly throughout the day.  Protect yourself by wearing long sleeves, pants, a wide-brimmed hat, and sunglasses whenever you are outside. HEART DISEASE, DIABETES, AND HIGH BLOOD PRESSURE   High blood pressure causes heart disease and increases the risk of stroke. High blood pressure is more likely to develop in:  People who have blood pressure in the high end   of the normal range (130-139/85-89 mm Hg).  People who are overweight or obese.  People who are African American.  If you are 38-23 years of age, have your blood pressure checked every 3-5 years. If you are 61 years of age or older, have your blood pressure checked every year. You should have your blood pressure measured twice--once when you are at a hospital or clinic, and once when you are not at a hospital or clinic. Record the average of the two measurements. To check your blood pressure when you are not at a hospital or clinic, you can use:  An automated blood pressure machine at a pharmacy.  A home blood pressure monitor.  If you are between 45 years and 39 years old, ask your health care provider if you should take aspirin to prevent strokes.  Have regular diabetes screenings. This involves taking a blood sample to check your fasting blood sugar level.  If you are at a normal weight and have a low risk for diabetes,  have this test once every three years after 52 years of age.  If you are overweight and have a high risk for diabetes, consider being tested at a younger age or more often. PREVENTING INFECTION  Hepatitis B  If you have a higher risk for hepatitis B, you should be screened for this virus. You are considered at high risk for hepatitis B if:  You were born in a country where hepatitis B is common. Ask your health care provider which countries are considered high risk.  Your parents were born in a high-risk country, and you have not been immunized against hepatitis B (hepatitis B vaccine).  You have HIV or AIDS.  You use needles to inject street drugs.  You live with someone who has hepatitis B.  You have had sex with someone who has hepatitis B.  You get hemodialysis treatment.  You take certain medicines for conditions, including cancer, organ transplantation, and autoimmune conditions. Hepatitis C  Blood testing is recommended for:  Everyone born from [birth year] through 1963.  Anyone with known risk factors for hepatitis C. Sexually transmitted infections (STIs)  You should be screened for sexually transmitted infections (STIs) including gonorrhea and chlamydia if:  You are sexually active and are younger than 52 years of age.  You are older than 53 years of age and your health care provider tells you that you are at risk for this type of infection.  Your sexual activity has changed since you were last screened and you are at an increased risk for chlamydia or gonorrhea. Ask your health care provider if you are at risk.  If you do not have HIV, but are at risk, it may be recommended that you take a prescription medicine daily to prevent HIV infection. This is called pre-exposure prophylaxis (PrEP). You are considered at risk if:  You are sexually active and do not regularly use condoms or know the HIV status of your partner(s).  You take drugs by injection.  You are sexually  active with a partner who has HIV. Talk with your health care provider about whether you are at high risk of being infected with HIV. If you choose to begin PrEP, you should first be tested for HIV. You should then be tested every 3 months for as long as you are taking PrEP.  PREGNANCY   If you are premenopausal and you may become pregnant, ask your health care provider about preconception counseling.  If you may  become pregnant, take 400 to 800 micrograms (mcg) of folic acid every day.  If you want to prevent pregnancy, talk to your health care provider about birth control (contraception). OSTEOPOROSIS AND MENOPAUSE   Osteoporosis is a disease in which the bones lose minerals and strength with aging. This can result in serious bone fractures. Your risk for osteoporosis can be identified using a bone density scan.  If you are 61 years of age or older, or if you are at risk for osteoporosis and fractures, ask your health care provider if you should be screened.  Ask your health care provider whether you should take a calcium or vitamin D supplement to lower your risk for osteoporosis.  Menopause may have certain physical symptoms and risks.  Hormone replacement therapy may reduce some of these symptoms and risks. Talk to your health care provider about whether hormone replacement therapy is right for you.  HOME CARE INSTRUCTIONS   Schedule regular health, dental, and eye exams.  Stay current with your immunizations.   Do not use any tobacco products including cigarettes, chewing tobacco, or electronic cigarettes.  If you are pregnant, do not drink alcohol.  If you are breastfeeding, limit how much and how often you drink alcohol.  Limit alcohol intake to no more than 1 drink per day for nonpregnant women. One drink equals 12 ounces of beer, 5 ounces of wine, or 1 ounces of hard liquor.  Do not use street drugs.  Do not share needles.  Ask your health care provider for help if  you need support or information about quitting drugs.  Tell your health care provider if you often feel depressed.  Tell your health care provider if you have ever been abused or do not feel safe at home.   This information is not intended to replace advice given to you by your health care provider. Make sure you discuss any questions you have with your health care provider.   Document Released: 12/24/2010 Document Revised: 07/01/2014 Document Reviewed: 05/12/2013 Elsevier Interactive Patient Education [employer].

## 2015-05-27 LAB — ANEMIA PROFILE B
BASOS ABS: 0.1 10*3/uL (ref 0.0–0.2)
Basos: 1 %
EOS (ABSOLUTE): 0.2 10*3/uL (ref 0.0–0.4)
Eos: 3 %
FERRITIN: 37 ng/mL (ref 15–150)
FOLATE: 16.6 ng/mL (ref >3.0)
HEMATOCRIT: 43.2 % (ref 34.0–46.6)
HEMOGLOBIN: 14.4 g/dL (ref 11.1–15.9)
IRON SATURATION: 11 % — AB (ref 15–55)
IRON: 38 ug/dL (ref 27–159)
Immature Grans (Abs): 0 10*3/uL (ref 0.0–0.1)
Immature Granulocytes: 0 %
LYMPHS ABS: 1.6 10*3/uL (ref 0.7–3.1)
LYMPHS: 23 %
MCH: 29.4 pg (ref 26.6–33.0)
MCHC: 33.3 g/dL (ref 31.5–35.7)
MCV: 88 fL (ref 79–97)
MONOCYTES: 9 %
Monocytes Absolute: 0.6 10*3/uL (ref 0.1–0.9)
NEUTROS ABS: 4.5 10*3/uL (ref 1.4–7.0)
Neutrophils: 64 %
Platelets: 276 10*3/uL (ref 150–379)
RBC: 4.89 x10E6/uL (ref 3.77–5.28)
RDW: 14.2 % (ref 12.3–15.4)
Retic Ct Pct: 0.9 % (ref 0.6–2.6)
Total Iron Binding Capacity: 355 ug/dL (ref 250–450)
UIBC: 317 ug/dL (ref 131–425)
VITAMIN B 12: 485 pg/mL (ref 211–946)
WBC: 7 10*3/uL (ref 3.4–10.8)

## 2015-05-27 LAB — CMP14+EGFR
ALBUMIN: 4 g/dL (ref 3.5–5.5)
ALT: 10 IU/L (ref 0–32)
AST: 17 IU/L (ref 0–40)
Albumin/Globulin Ratio: 1.5 (ref 1.1–2.5)
Alkaline Phosphatase: 85 IU/L (ref 39–117)
BUN/Creatinine Ratio: 13 (ref 9–23)
BUN: 8 mg/dL (ref 6–24)
CHLORIDE: 97 mmol/L (ref 97–106)
CO2: 22 mmol/L (ref 18–29)
CREATININE: 0.61 mg/dL (ref 0.57–1.00)
Calcium: 9.5 mg/dL (ref 8.7–10.2)
GFR calc non Af Amer: 105 mL/min/{1.73_m2} (ref >59)
GFR, EST AFRICAN AMERICAN: 121 mL/min/{1.73_m2} (ref >59)
GLUCOSE: 91 mg/dL (ref 65–99)
Globulin, Total: 2.7 g/dL (ref 1.5–4.5)
Potassium: 4.4 mmol/L (ref 3.5–5.2)
Sodium: 137 mmol/L (ref 136–144)
TOTAL PROTEIN: 6.7 g/dL (ref 6.0–8.5)

## 2015-05-27 LAB — VITAMIN D 25 HYDROXY (VIT D DEFICIENCY, FRACTURES): VIT D 25 HYDROXY: 30.6 ng/mL (ref 30.0–100.0)

## 2015-05-27 LAB — THYROID PANEL WITH TSH
Free Thyroxine Index: 1.7 (ref 1.2–4.9)
T3 Uptake Ratio: 24 % (ref 24–39)
T4, Total: 7.1 ug/dL (ref 4.5–12.0)
TSH: 2.76 u[IU]/mL (ref 0.450–4.500)

## 2015-05-27 LAB — LIPID PANEL
CHOLESTEROL TOTAL: 181 mg/dL (ref 100–199)
Chol/HDL Ratio: 5.3 ratio units — ABNORMAL HIGH (ref 0.0–4.4)
HDL: 34 mg/dL — AB (ref >39)
LDL CALC: 115 mg/dL — AB (ref 0–99)
Triglycerides: 159 mg/dL — ABNORMAL HIGH (ref 0–149)
VLDL CHOLESTEROL CAL: 32 mg/dL (ref 5–40)

## 2015-05-27 LAB — HEPATITIS C ANTIBODY

## 2015-05-29 NOTE — Progress Notes (Signed)
Patient aware.

## 2015-05-31 LAB — PAP IG W/ RFLX HPV ASCU: PAP Smear Comment: 0

## 2015-06-14 ENCOUNTER — Other Ambulatory Visit: Payer: Self-pay | Admitting: Family Medicine

## 2015-08-09 ENCOUNTER — Encounter: Payer: Self-pay | Admitting: Family Medicine

## 2015-08-09 ENCOUNTER — Ambulatory Visit (INDEPENDENT_AMBULATORY_CARE_PROVIDER_SITE_OTHER): Payer: BLUE CROSS/BLUE SHIELD | Admitting: Family Medicine

## 2015-08-09 VITALS — BP 133/86 | HR 67 | Temp 98.2°F | Ht 65.0 in | Wt 147.4 lb

## 2015-08-09 DIAGNOSIS — I4891 Unspecified atrial fibrillation: Secondary | ICD-10-CM

## 2015-08-09 DIAGNOSIS — I10 Essential (primary) hypertension: Secondary | ICD-10-CM

## 2015-08-09 DIAGNOSIS — R600 Localized edema: Secondary | ICD-10-CM

## 2015-08-09 DIAGNOSIS — E039 Hypothyroidism, unspecified: Secondary | ICD-10-CM

## 2015-08-09 MED ORDER — METOPROLOL SUCCINATE ER 50 MG PO TB24: 25.0000 mg | ORAL_TABLET | Freq: Every day | ORAL | Status: DC

## 2015-08-09 MED ORDER — METOPROLOL SUCCINATE ER 50 MG PO TB24: 50.0000 mg | ORAL_TABLET | Freq: Every day | ORAL | Status: DC

## 2015-08-09 NOTE — Progress Notes (Signed)
Subjective:  Patient ID: Connie Schultz, female    DOB: [birth year]  Age: 55 y.o. MRN: [id number]  CC: Edema and Headache   HPI Connie Schultz presents for 3 days of increasing edema at ankles. No dyspnea. No oliguria. No chest or back pain. Has not changed her fluid intake.   History [name] has a past medical history of Palpitations; Essential hypertension; Hypothyroidism; Anxiety; Scoliosis; and Depression.   She has past surgical history that includes Tonsillectomy and adenoidectomy.   Her family history includes COPD in her father; Cancer in her sister.She reports that she has been smoking Cigarettes.  She has been smoking about 1.00 pack per day. She has never used smokeless tobacco. She reports that she does not drink alcohol or use illicit drugs.    ROS Review of Systems  Constitutional: Negative for fever, activity change and appetite change.  HENT: Negative for congestion, rhinorrhea and sore throat.   Eyes: Negative for visual disturbance.  Respiratory: Negative for cough and shortness of breath.   Cardiovascular: Negative for chest pain and palpitations.  Gastrointestinal: Negative for nausea, abdominal pain and diarrhea.  Genitourinary: Negative for dysuria.  Musculoskeletal: Negative for myalgias and arthralgias.    Objective:  BP 133/86 mmHg  Pulse 67  Temp(Src) 98.2 F (36.8 C) (Oral)  Ht 5' 5"  (1.651 m)  Wt 147 lb 6.4 oz (66.86 kg)  BMI 24.53 kg/m2  SpO2 99%  BP Readings from Last 3 Encounters:  08/09/15 133/86  05/26/15 120/82  04/27/15 122/73    Wt Readings from Last 3 Encounters:  08/09/15 147 lb 6.4 oz (66.86 kg)  05/26/15 151 lb (68.493 kg)  04/27/15 149 lb 3.2 oz (67.677 kg)     Physical Exam  Constitutional: She is oriented to person, place, and time. She appears well-developed and well-nourished. No distress.  HENT:  Head: Normocephalic and atraumatic.  Right Ear: External ear normal.  Left Ear: External ear normal.  Nose: Nose normal.   Mouth/Throat: Oropharynx is clear and moist.  Eyes: Conjunctivae and EOM are normal. Pupils are equal, round, and reactive to light.  Neck: Normal range of motion. Neck supple. No thyromegaly present.  Cardiovascular: Normal rate, regular rhythm and normal heart sounds.   No murmur heard. Pulmonary/Chest: Effort normal and breath sounds normal. No respiratory distress. She has no wheezes. She has no rales.  Abdominal: Soft. Bowel sounds are normal. She exhibits no distension. There is no tenderness.  Musculoskeletal: She exhibits edema (1+ at ankles).  Lymphadenopathy:    She has no cervical adenopathy.  Neurological: She is alert and oriented to person, place, and time. She has normal reflexes.  Skin: Skin is warm and dry.  Psychiatric: She has a normal mood and affect. Her behavior is normal. Judgment and thought content normal.     Lab Results  Component Value Date   WBC 7.0 05/26/2015   HCT 43.2 05/26/2015   PLT 276 05/26/2015   GLUCOSE 91 05/26/2015   CHOL 181 05/26/2015   TRIG 159* 05/26/2015   HDL 34* 05/26/2015   LDLCALC 115* 05/26/2015   ALT 10 05/26/2015   AST 17 05/26/2015   NA 137 05/26/2015   K 4.4 05/26/2015   CL 97 05/26/2015   CREATININE 0.61 05/26/2015   BUN 8 05/26/2015   CO2 22 05/26/2015   TSH 2.760 05/26/2015    No results found.  Assessment & Plan:   Connie Schultz was seen today for edema and headache.  Diagnoses and all orders for this visit:  Localized edema -     CMP14+EGFR -     Brain natriuretic peptide  Hypothyroidism, unspecified hypothyroidism type -     CMP14+EGFR -     TSH + free T4  Essential hypertension -     Discontinue: metoprolol succinate (TOPROL-XL) 50 MG 24 hr tablet; Take 1 tablet (50 mg total) by mouth daily. -     metoprolol succinate (TOPROL-XL) 50 MG 24 hr tablet; Take 1 tablet (50 mg total) by mouth daily. -     CMP14+EGFR  Atrial fibrillation, unspecified -     Discontinue: metoprolol succinate (TOPROL-XL) 50 MG 24  hr tablet; Take 1 tablet (50 mg total) by mouth daily. -     metoprolol succinate (TOPROL-XL) 50 MG 24 hr tablet; Take 1 tablet (50 mg total) by mouth daily. -     CMP14+EGFR      I am having Ms. Eisenberger maintain her vitamin C, calcium carbonate, omeprazole, levothyroxine, nitroGLYCERIN, clonazePAM, DULoxetine, and metoprolol succinate.  Meds ordered this encounter  Medications  . DISCONTD: metoprolol succinate (TOPROL-XL) 50 MG 24 hr tablet    Sig: Take 1 tablet (50 mg total) by mouth daily.    Dispense:  30 tablet    Refill:  2  . metoprolol succinate (TOPROL-XL) 50 MG 24 hr tablet    Sig: Take 1 tablet (50 mg total) by mouth daily.    Dispense:  30 tablet    Refill:  2     Follow-up: Return in about 3 months (around 11/06/2015), or if symptoms worsen or fail to improve.  Claretta Fraise, M.D.

## 2015-08-10 LAB — CMP14+EGFR
A/G RATIO: 1.7 (ref 1.1–2.5)
ALBUMIN: 3.9 g/dL (ref 3.5–5.5)
ALT: 15 IU/L (ref 0–32)
AST: 21 IU/L (ref 0–40)
Alkaline Phosphatase: 71 IU/L (ref 39–117)
BUN / CREAT RATIO: 9 (ref 9–23)
BUN: 6 mg/dL (ref 6–24)
Bilirubin Total: <0.2 mg/dL (ref 0.0–1.2)
CALCIUM: 9.1 mg/dL (ref 8.7–10.2)
CO2: 26 mmol/L (ref 18–29)
CREATININE: 0.66 mg/dL (ref 0.57–1.00)
Chloride: 101 mmol/L (ref 96–106)
GFR, EST AFRICAN AMERICAN: 117 mL/min/{1.73_m2} (ref >59)
GFR, EST NON AFRICAN AMERICAN: 102 mL/min/{1.73_m2} (ref >59)
GLOBULIN, TOTAL: 2.3 g/dL (ref 1.5–4.5)
Glucose: 88 mg/dL (ref 65–99)
Potassium: 4.1 mmol/L (ref 3.5–5.2)
SODIUM: 141 mmol/L (ref 134–144)
Total Protein: 6.2 g/dL (ref 6.0–8.5)

## 2015-08-10 LAB — TSH+FREE T4
FREE T4: 0.92 ng/dL (ref 0.82–1.77)
TSH: 2.52 u[IU]/mL (ref 0.450–4.500)

## 2015-08-10 LAB — BRAIN NATRIURETIC PEPTIDE: BNP: 80.6 pg/mL (ref 0.0–100.0)

## 2015-08-11 ENCOUNTER — Other Ambulatory Visit: Payer: Self-pay | Admitting: Family Medicine

## 2015-08-15 ENCOUNTER — Telehealth: Payer: Self-pay | Admitting: Family Medicine

## 2015-10-16 ENCOUNTER — Encounter: Payer: BLUE CROSS/BLUE SHIELD | Admitting: *Deleted

## 2015-11-03 ENCOUNTER — Other Ambulatory Visit: Payer: Self-pay | Admitting: Family Medicine

## 2015-11-03 ENCOUNTER — Ambulatory Visit (INDEPENDENT_AMBULATORY_CARE_PROVIDER_SITE_OTHER): Payer: BLUE CROSS/BLUE SHIELD | Admitting: Family Medicine

## 2015-11-03 ENCOUNTER — Encounter: Payer: Self-pay | Admitting: Family Medicine

## 2015-11-03 VITALS — BP 132/87 | HR 67 | Temp 97.5°F | Ht 65.0 in | Wt 150.0 lb

## 2015-11-03 DIAGNOSIS — K589 Irritable bowel syndrome without diarrhea: Secondary | ICD-10-CM

## 2015-11-03 MED ORDER — ELUXADOLINE 100 MG PO TABS
1.0000 | ORAL_TABLET | Freq: Two times a day (BID) | ORAL | Status: DC
Start: 2015-11-03 — End: 2018-12-15

## 2015-11-03 NOTE — Progress Notes (Signed)
BP 132/87 mmHg  Pulse 67  Temp(Src) 97.5 F (36.4 C) (Oral)  Ht 5\' 5"  (1.651 m)  Wt 150 lb (68.04 kg)  BMI 24.96 kg/m2   Subjective:    Patient ID: Connie Schultz, female    DOB: 05/14/1961, 55 y.o.   MRN: [id number]  HPI: Connie BowlBrenda Reddish is a 55 y.o. female presenting on 11/03/2015 for Nausea and vomiting; Legs are aching and feel weak; and Swelling in ankles   HPI Nausea and vomiting and diarrhea Patient has been having intermittent nausea with some vomiting this mostly just acid and bile that is also been associated with intermittent diarrhea that's been going on for at least a few years. She denies any fevers or chills or blood in her stool. She has been told that she has IBS before and is mostly diarrhea. She is also had colonoscopies previously and testing but never found anything besides the IBS. She is coming in today because she is wondering if there is any medication that can help with these symptoms as she is getting sick of dealing with them and feels like they are starting to cause her to have weakness and deficiencies in her legs.her abdominal pain is mild and diffuse and she is unable to pinpoint exactly where it is.she has 1-3 loose stools a day. She will still have some intermittent normal stools in between  Relevant past medical, surgical, family and social history reviewed and updated as indicated. Interim medical history since our last visit reviewed. Allergies and medications reviewed and updated.  Review of Systems  Constitutional: Negative for fever and chills.  HENT: Negative for congestion, ear discharge and ear pain.   Eyes: Negative for redness and visual disturbance.  Respiratory: Negative for chest tightness and shortness of breath.   Cardiovascular: Negative for chest pain and leg swelling.  Gastrointestinal: Positive for nausea, vomiting, abdominal pain and diarrhea. Negative for constipation, blood in stool and rectal pain.  Genitourinary: Negative for  dysuria and difficulty urinating.  Musculoskeletal: Negative for back pain and gait problem.  Skin: Negative for rash.  Neurological: Negative for light-headedness and headaches.  Psychiatric/Behavioral: Negative for behavioral problems and agitation.  All other systems reviewed and are negative.   Per HPI unless specifically indicated above     Medication List       This list is accurate as of: 11/03/15  1:00 PM.  Always use your most recent med list.               calcium carbonate 600 MG Tabs tablet  Commonly known as:  OS-CAL  Take 600 mg by mouth daily.     clonazePAM 1 MG tablet  Commonly known as:  KLONOPIN  Take 1 tablet (1 mg total) by mouth at bedtime.     DULoxetine 30 MG capsule  Commonly known as:  CYMBALTA  TAKE THREE CAPSULES BY MOUTH ONCE DAILY     Eluxadoline 100 MG Tabs  Commonly known as:  VIBERZI  Take 1 tablet by mouth 2 (two) times daily.     levothyroxine 75 MCG tablet  Commonly known as:  SYNTHROID, LEVOTHROID  Take 1 tablet (75 mcg total) by mouth daily.     metoprolol succinate 50 MG 24 hr tablet  Commonly known as:  TOPROL-XL  Take 1 tablet (50 mg total) by mouth daily.     nitroGLYCERIN 0.4 MG SL tablet  Commonly known as:  NITROSTAT  Place 1 tablet (0.4 mg total) under the tongue every 5 (  five) minutes as needed for chest pain (Also for the shoulder and arm pain and palpitations).     omeprazole 20 MG capsule  Commonly known as:  PRILOSEC  Take 20 mg by mouth daily.     vitamin C 500 MG tablet  Commonly known as:  ASCORBIC ACID  Take 500 mg by mouth daily.           Objective:    BP 132/87 mmHg  Pulse 67  Temp(Src) 97.5 F (36.4 C) (Oral)  Ht  (1.651 m)  Wt 150 lb (68.04 kg)  BMI 24.96 kg/m2  Wt Readings from Last 3 Encounters:  11/03/15 150 lb (68.04 kg)  08/09/15 147 lb 6.4 oz (66.86 kg)  05/26/15 151 lb (68.493 kg)    Physical Exam  Constitutional: She is oriented to person, place, and time. She appears  well-developed and well-nourished. No distress.  Eyes: Conjunctivae and EOM are normal. Pupils are equal, round, and reactive to light.  Cardiovascular: Normal rate, regular rhythm, normal heart sounds and intact distal pulses.   No murmur heard. Pulmonary/Chest: Effort normal and breath sounds normal. No respiratory distress. She has no wheezes.  Abdominal: Soft. Bowel sounds are normal. She exhibits no distension. There is no hepatosplenomegaly. There is generalized tenderness (Diffuse mild abdominal pain, no CVA tenderness). There is no rigidity, no rebound, no guarding and no CVA tenderness.  Musculoskeletal: Normal range of motion. She exhibits no edema or tenderness.  Neurological: She is alert and oriented to person, place, and time. Coordination normal.  Skin: Skin is warm and dry. No rash noted. She is not diaphoretic.  Psychiatric: She has a normal mood and affect. Her behavior is normal.  Nursing note and vitals reviewed.     Assessment & Plan:       Problem List Items Addressed This Visit    None    Visit Diagnoses    IBS (irritable bowel syndrome)    -  Primary    Patient has had 2 years of irritable bowels and diarrhea, had colonoscopy already no signs of inflammatory. Will try Viberzi    Relevant Medications    Eluxadoline (VIBERZI) 100 MG TABS        Follow up plan: Return in about 4 weeks (around 12/01/2015), or if symptoms worsen or fail to improve, for Recheck on bowel.  Counseling provided for all of the vaccine components No orders of the defined types were placed in this encounter.    Arville Care, MD [facility] 11/03/2015, 1:00 PM

## 2015-11-06 ENCOUNTER — Other Ambulatory Visit: Payer: Self-pay | Admitting: Family Medicine

## 2015-11-22 ENCOUNTER — Encounter: Payer: Self-pay | Admitting: Family Medicine

## 2015-11-22 ENCOUNTER — Ambulatory Visit (INDEPENDENT_AMBULATORY_CARE_PROVIDER_SITE_OTHER): Payer: BLUE CROSS/BLUE SHIELD | Admitting: Family Medicine

## 2015-11-22 VITALS — BP 150/86 | HR 60 | Temp 97.5°F | Ht 65.0 in | Wt 154.2 lb

## 2015-11-22 DIAGNOSIS — J201 Acute bronchitis due to Hemophilus influenzae: Secondary | ICD-10-CM | POA: Diagnosis not present

## 2015-11-22 MED ORDER — AMOXICILLIN-POT CLAVULANATE 875-125 MG PO TABS: 1.0000 | ORAL_TABLET | Freq: Two times a day (BID) | ORAL | Status: DC

## 2015-11-22 MED ORDER — BENZONATATE 200 MG PO CAPS: 200.0000 mg | ORAL_CAPSULE | Freq: Three times a day (TID) | ORAL | Status: AC | PRN

## 2015-11-22 MED ORDER — BETAMETHASONE SOD PHOS & ACET 6 (3-3) MG/ML IJ SUSP
6.0000 mg | Freq: Once | INTRAMUSCULAR | Status: AC
  Administered 2015-11-22: 6 mg via INTRAMUSCULAR

## 2015-11-22 NOTE — Addendum Note (Signed)
Addended by: Bearl MulberryUTHERFORD, Iyannah Blake K on: 11/22/2015 03:59 PM   Modules accepted: Kipp BroodSmartSet

## 2015-11-22 NOTE — Progress Notes (Signed)
Subjective:  Patient ID: Connie Schultz, female    DOB: 08/31/1960  Age: 55 y.o. MRN: [id number]  CC: URI   HPI Connie Schultz presents for Patient presents with Cough and fever. Denies rhinorrhea and sore throat. Patient reports coughing frequently as well. Producing moderate amounts of purulent sputum noted. There is subjective fever with intermittent chills and sweats. The patient reports being short of breath. Onset was 7 days ago. Gradually worsening until this AM when chest felt tight and she became dyspneic.  History Connie Schultz has a past medical history of Palpitations; Essential hypertension; Hypothyroidism; Anxiety; Scoliosis; and Depression.   She has past surgical history that includes Tonsillectomy and adenoidectomy.   Her family history includes COPD in her father; Cancer in her sister.She reports that she has been smoking Cigarettes.  She has been smoking about 1.00 pack per day. She has never used smokeless tobacco. She reports that she does not drink alcohol or use illicit drugs.  Current Outpatient Prescriptions on File Prior to Visit  Medication Sig Dispense Refill  . Ascorbic Acid (VITAMIN C) 500 MG tablet Take 500 mg by mouth daily.      . calcium carbonate (OS-CAL) 600 MG TABS Take 600 mg by mouth daily.      . clonazePAM (KLONOPIN) 1 MG tablet Take 1 tablet (1 mg total) by mouth at bedtime. 30 tablet 3  . DULoxetine (CYMBALTA) 30 MG capsule TAKE THREE CAPSULES BY MOUTH ONCE DAILY 90 capsule 2  . Eluxadoline (VIBERZI) 100 MG TABS Take 1 tablet by mouth 2 (two) times daily. 60 tablet 2  . levothyroxine (SYNTHROID, LEVOTHROID) 75 MCG tablet TAKE ONE TABLET BY MOUTH ONCE DAILY. 90 tablet 1  . metoprolol succinate (TOPROL-XL) 50 MG 24 hr tablet TAKE ONE TABLET BY MOUTH DAILY. 30 tablet 5  . nitroGLYCERIN (NITROSTAT) 0.4 MG SL tablet Place 1 tablet (0.4 mg total) under the tongue every 5 (five) minutes as needed for chest pain (Also for the shoulder and arm pain and  palpitations). 50 tablet 3  . omeprazole (PRILOSEC) 20 MG capsule Take 20 mg by mouth daily.  3   No current facility-administered medications on file prior to visit.    ROS Review of Systems  Constitutional: Negative for fever, chills, activity change and appetite change.  HENT: Negative for congestion, ear discharge, ear pain, hearing loss, nosebleeds, postnasal drip, rhinorrhea, sinus pressure, sneezing and trouble swallowing.   Respiratory: Positive for cough, chest tightness and shortness of breath.   Cardiovascular: Negative for chest pain and palpitations.  Skin: Negative for rash.    Objective:  BP 150/86 mmHg  Pulse 60  Temp(Src) 97.5 F (36.4 C) (Oral)  Ht 5\' 5"  (1.651 m)  Wt 154 lb 3.2 oz (69.945 kg)  BMI 25.66 kg/m2  SpO2 97%  Physical Exam  Constitutional: She appears well-developed and well-nourished.  HENT:  Head: Normocephalic and atraumatic.  Right Ear: Tympanic membrane and external ear normal. No decreased hearing is noted.  Left Ear: Tympanic membrane and external ear normal. No decreased hearing is noted.  Nose: Mucosal edema present. Right sinus exhibits no frontal sinus tenderness. Left sinus exhibits no frontal sinus tenderness.  Mouth/Throat: No oropharyngeal exudate or posterior oropharyngeal erythema.  Neck: No Brudzinski's sign noted.  Pulmonary/Chest: No respiratory distress. She has wheezes.  Lymphadenopathy:       Head (right side): No preauricular adenopathy present.       Head (left side): No preauricular adenopathy present.       Right  cervical: No superficial cervical adenopathy present.      Left cervical: No superficial cervical adenopathy present.    Assessment & Plan:   Anishka was seen today for uri.  Diagnoses and all orders for this visit:  Acute bronchitis due to Haemophilus influenzae -     betamethasone acetate-betamethasone sodium phosphate (CELESTONE) injection 6 mg; Inject 1 mL (6 mg total) into the muscle once.  Other  orders -     amoxicillin-clavulanate (AUGMENTIN) 875-125 MG tablet; Take 1 tablet by mouth 2 (two) times daily. Take all of this medication -     benzonatate (TESSALON) 200 MG capsule; Take 1 capsule (200 mg total) by mouth 3 (three) times daily as needed for cough.   I am having Connie Schultz start on amoxicillin-clavulanate and benzonatate. I am also having her maintain her vitamin C, calcium carbonate, omeprazole, nitroGLYCERIN, clonazePAM, DULoxetine, Eluxadoline, metoprolol succinate, and levothyroxine. We will continue to administer betamethasone acetate-betamethasone sodium phosphate.  Meds ordered this encounter  Medications  . betamethasone acetate-betamethasone sodium phosphate (CELESTONE) injection 6 mg    Sig:   . amoxicillin-clavulanate (AUGMENTIN) 875-125 MG tablet    Sig: Take 1 tablet by mouth 2 (two) times daily. Take all of this medication    Dispense:  20 tablet    Refill:  0  . benzonatate (TESSALON) 200 MG capsule    Sig: Take 1 capsule (200 mg total) by mouth 3 (three) times daily as needed for cough.    Dispense:  20 capsule    Refill:  0     Follow-up: Return if symptoms worsen or fail to improve.  Mechele Claude, M.D.

## 2015-11-28 ENCOUNTER — Ambulatory Visit: Payer: 59 | Admitting: Family

## 2015-12-01 ENCOUNTER — Ambulatory Visit: Payer: BLUE CROSS/BLUE SHIELD | Admitting: Family Medicine

## 2015-12-05 ENCOUNTER — Encounter: Payer: Self-pay | Admitting: Family Medicine

## 2016-01-08 ENCOUNTER — Telehealth: Payer: Self-pay | Admitting: Family Medicine

## 2016-01-08 NOTE — Telephone Encounter (Signed)
Scheduled

## 2016-01-18 ENCOUNTER — Other Ambulatory Visit: Payer: Self-pay | Admitting: Family Medicine

## 2016-03-13 ENCOUNTER — Other Ambulatory Visit: Payer: Self-pay | Admitting: Family Medicine

## 2016-03-14 ENCOUNTER — Ambulatory Visit: Payer: BLUE CROSS/BLUE SHIELD | Admitting: Family

## 2016-03-15 ENCOUNTER — Ambulatory Visit: Payer: BLUE CROSS/BLUE SHIELD | Admitting: Family Medicine

## 2016-03-18 ENCOUNTER — Encounter: Payer: Self-pay | Admitting: Family Medicine

## 2016-03-18 ENCOUNTER — Encounter: Payer: BLUE CROSS/BLUE SHIELD | Admitting: *Deleted

## 2016-03-18 ENCOUNTER — Other Ambulatory Visit: Payer: Self-pay | Admitting: Family Medicine

## 2016-04-18 ENCOUNTER — Other Ambulatory Visit: Payer: Self-pay | Admitting: Family Medicine

## 2016-08-09 ENCOUNTER — Other Ambulatory Visit: Payer: Self-pay | Admitting: Family Medicine

## 2016-08-20 ENCOUNTER — Other Ambulatory Visit: Payer: Self-pay | Admitting: Family Medicine

## 2016-09-04 ENCOUNTER — Telehealth (INDEPENDENT_AMBULATORY_CARE_PROVIDER_SITE_OTHER): Payer: Self-pay | Admitting: Specialist

## 2016-09-04 NOTE — Telephone Encounter (Signed)
[facility] [phone]

## 2017-03-22 ENCOUNTER — Other Ambulatory Visit: Payer: Self-pay | Admitting: Family Medicine

## 2017-07-04 ENCOUNTER — Ambulatory Visit (HOSPITAL_COMMUNITY): Payer: Self-pay | Attending: Physical Medicine and Rehabilitation

## 2017-07-04 ENCOUNTER — Telehealth (HOSPITAL_COMMUNITY): Payer: Self-pay

## 2017-07-04 NOTE — Telephone Encounter (Signed)
Pt  called to r/s due to being sick today and missing her eval and pt confrimed address when making this new apptment. [id number]

## 2017-07-09 ENCOUNTER — Ambulatory Visit (HOSPITAL_COMMUNITY): Payer: Self-pay | Admitting: Physical Therapy

## 2017-07-09 ENCOUNTER — Telehealth (HOSPITAL_COMMUNITY): Payer: Self-pay | Admitting: Physical Therapy

## 2017-07-09 NOTE — Telephone Encounter (Signed)
Called patient this afternoon after she did not attend appointment at 1:45 on 07/09/17. Phone was busy when called on several attempts. Plan to call back later. Attempted 3:00 PM 07/09/17.  Verne CarrowMacy Ebrima Ranta PT, DPT 3:00 PM, 07/09/17 [phone]

## 2017-07-15 ENCOUNTER — Encounter: Payer: Self-pay | Admitting: *Deleted

## 2017-07-15 NOTE — Progress Notes (Deleted)
Cardiology Office Note  Date: 07/15/2017   ID: Connie Schultz, DOB 05/14/1961, MRN [id number]  PCP: Rebecka ApleyHemberg, Katherine V, NP  Evaluating Cardiologist: Nona DellSamuel McDowell, MD   No chief complaint on file.   History of Present Illness: Connie Schultz is a 57 y.o. female seen in consultation back in November 2016 for evaluation of atypical chest discomfort. GXT from that time was low risk. She is referred back to the office by Ms. Hemberg NP.  There looks to be some concern based on the recent PCP note about a prior diagnosis of atrial fibrillation, although records do not bear this out. She has a history of palpitations and previously documented atrial and ventricular ectopy, but no definitive atrial arrhythmias.  Past Medical History:  Diagnosis Date  . Anxiety   . Depression   . Essential hypertension   . Hypothyroidism   . Palpitations   . Scoliosis     Past Surgical History:  Procedure Laterality Date  . TONSILLECTOMY AND ADENOIDECTOMY      Current Outpatient Medications  Medication Sig Dispense Refill  . amoxicillin-clavulanate (AUGMENTIN) 875-125 MG tablet Take 1 tablet by mouth 2 (two) times daily. Take all of this medication 20 tablet 0  . Ascorbic Acid (VITAMIN C) 500 MG tablet Take 500 mg by mouth daily.      . benzonatate (TESSALON) 200 MG capsule Take 1 capsule (200 mg total) by mouth 3 (three) times daily as needed for cough. 20 capsule 0  . calcium carbonate (OS-CAL) 600 MG TABS Take 600 mg by mouth daily.      . clonazePAM (KLONOPIN) 1 MG tablet Take 1 tablet (1 mg total) by mouth at bedtime. 30 tablet 3  . DULoxetine (CYMBALTA) 30 MG capsule TAKE THREE CAPSULES BY MOUTH ONCE DAILY 90 capsule 0  . Eluxadoline (VIBERZI) 100 MG TABS Take 1 tablet by mouth 2 (two) times daily. 60 tablet 2  . levothyroxine (SYNTHROID, LEVOTHROID) 75 MCG tablet TAKE ONE TABLET BY MOUTH ONCE DAILY. 90 tablet 3  . metoprolol succinate (TOPROL-XL) 50 MG 24 hr tablet TAKE ONE TABLET BY  MOUTH DAILY. 30 tablet 1  . nitroGLYCERIN (NITROSTAT) 0.4 MG SL tablet Place 1 tablet (0.4 mg total) under the tongue every 5 (five) minutes as needed for chest pain (Also for the shoulder and arm pain and palpitations). 50 tablet 3  . omeprazole (PRILOSEC) 20 MG capsule Take 20 mg by mouth daily.  3   No current facility-administered medications for this visit.    Allergies:  Sulfa antibiotics   Social History: The patient  reports that she has been smoking cigarettes.  She has been smoking about 1.00 pack per day. she has never used smokeless tobacco. She reports that she does not drink alcohol or use drugs.   Family History: The patient's family history includes COPD in her father; Cancer in her sister.   ROS:  Please see the history of present illness. Otherwise, complete review of systems is positive for {NONE DEFAULTED:18576::"none"}.  All other systems are reviewed and negative.   Physical Exam: VS:  There were no vitals taken for this visit., BMI There is no height or weight on file to calculate BMI.  Wt Readings from Last 3 Encounters:  11/22/15 154 lb 3.2 oz (69.9 kg)  11/03/15 150 lb (68 kg)  08/09/15 147 lb 6.4 oz (66.9 kg)    General: Patient appears comfortable at rest. HEENT: Conjunctiva and lids normal, oropharynx clear with moist mucosa. Neck: Supple, no elevated  JVP or carotid bruits, no thyromegaly. Lungs: Clear to auscultation, nonlabored breathing at rest. Cardiac: Regular rate and rhythm, no S3 or significant systolic murmur, no pericardial rub. Abdomen: Soft, nontender, no hepatomegaly, bowel sounds present, no guarding or rebound. Extremities: No pitting edema, distal pulses 2+. Skin: Warm and dry. Musculoskeletal: No kyphosis. Neuropsychiatric: Alert and oriented x3, affect grossly appropriate.  ECG: I personally reviewed the tracing from 04/27/2015 which showed normal sinus rhythm.  Recent Labwork:    Component Value Date/Time   CHOL 181 05/26/2015 1305     TRIG 159 (H) 05/26/2015 1305   HDL 34 (L) 05/26/2015 1305   CHOLHDL 5.3 (H) 05/26/2015 1305   LDLCALC 115 (H) 05/26/2015 1305  February 2017: TSH 2.52, BUN 6, creatinine 0.66, potassium 4.1, AST 21, ALT 15  Other Studies Reviewed Today:  GXT 05/05/2015: There was no ST segment deviation noted during stress.  Arrhythmias during stress: PACs.  Arrhythmias during recovery: none.  Arrhythmias were not significant.  ECG was interpretable.  No diagnostic ST segment changes. Low risk Duke treadmill score of 8.  Assessment and Plan:    Current medicines were reviewed with the patient today.  No orders of the defined types were placed in this encounter.   Disposition:  Signed, Jonelle Sidle, MD, [facility] 07/15/2017 10:34 AM    [facility] at [facility] [address] [place], [place], [place] [address] Phone: [phone]; Fax: [phone]

## 2017-07-16 ENCOUNTER — Ambulatory Visit: Payer: Self-pay | Admitting: Cardiology

## 2017-07-17 ENCOUNTER — Encounter: Payer: Self-pay | Admitting: Cardiology

## 2017-08-21 ENCOUNTER — Ambulatory Visit (HOSPITAL_COMMUNITY): Payer: Self-pay | Admitting: Psychiatry

## 2018-04-20 ENCOUNTER — Encounter (INDEPENDENT_AMBULATORY_CARE_PROVIDER_SITE_OTHER): Payer: Self-pay | Admitting: *Deleted

## 2018-12-15 ENCOUNTER — Ambulatory Visit: Payer: BC Managed Care – PPO | Admitting: Gastroenterology

## 2018-12-15 ENCOUNTER — Other Ambulatory Visit: Payer: Self-pay

## 2018-12-15 ENCOUNTER — Encounter: Payer: Self-pay | Admitting: Gastroenterology

## 2018-12-15 VITALS — BP 98/66 | HR 97 | Temp 97.1°F | Ht 64.0 in | Wt 159.6 lb

## 2018-12-15 DIAGNOSIS — K219 Gastro-esophageal reflux disease without esophagitis: Secondary | ICD-10-CM | POA: Diagnosis not present

## 2018-12-15 DIAGNOSIS — R194 Change in bowel habit: Secondary | ICD-10-CM

## 2018-12-15 DIAGNOSIS — R197 Diarrhea, unspecified: Secondary | ICD-10-CM | POA: Diagnosis not present

## 2018-12-15 MED ORDER — PANTOPRAZOLE SODIUM 40 MG PO TBEC: 40.0000 mg | DELAYED_RELEASE_TABLET | Freq: Every day | ORAL | 3 refills | Status: DC

## 2018-12-15 MED ORDER — PEG 3350-KCL-NA BICARB-NACL 420 G PO SOLR: 4000.0000 mL | ORAL | 0 refills | Status: AC

## 2018-12-15 NOTE — Assessment & Plan Note (Signed)
Start Protonix once daily. No alarm signs/symptoms. Sent to pharmacy. Return in follow-up after colonoscopy.

## 2018-12-15 NOTE — Patient Instructions (Addendum)
I have sent in Protonix for reflux. Take this once each morning, 30 minutes before breakfast.  Please have blood work and stool tests done. If stool tests are negative, we will let you know. Then, you can start on Viberzi once a day with food. We can increase to twice a day if needed. Don't start the Viberzi until we let you know it's ok!  We have arranged a colonoscopy with Dr. Jena Gaussourk in the near future.   Avoid dairy products for now.   We will see you back in follow-up thereafter!  It was a pleasure to see you today. I strive to create trusting relationships with patients to provide genuine, compassionate, and quality care. I value your feedback. If you receive a survey regarding your visit,  I greatly appreciate you taking time to fill this out.   Gelene MinkAnna W. Jaime Grizzell, PhD, ANP-BC [facility]    Lactose-Free Diet, Adult If you have lactose intolerance, you are not able to digest lactose. Lactose is a natural sugar found mainly in dairy milk and dairy products. You may need to avoid all foods and beverages that contain lactose. A lactose-free diet can help you do this. Which foods have lactose? Lactose is found in dairy milk and dairy products, such as:  Yogurt.  Cheese.  Butter.  Margarine.  Sour cream.  Cream.  Whipped toppings and nondairy creamers.  Ice cream and other dairy-based desserts. Lactose is also found in foods or products made with dairy milk or milk ingredients. To find out whether a food contains dairy milk or a milk ingredient, look at the ingredients list. Avoid foods with the statement "May contain milk" and foods that contain:  Milk powder.  Whey.  Curd.  Caseinate.  Lactose.  Lactalbumin.  Lactoglobulin. What are alternatives to dairy milk and foods made with milk products?  Lactose-free milk.  Soy milk with added calcium and vitamin D.  Almond milk, coconut milk, rice milk, or other nondairy milk alternatives with added  calcium and vitamin D. Note that these are low in protein.  Soy products, such as soy yogurt, soy cheese, soy ice cream, and soy-based sour cream.  Other nut milk products, such as almond yogurt, almond cheese, cashew yogurt, cashew cheese, cashew ice cream, coconut yogurt, and coconut ice cream. What are tips for following this plan?  Do not consume foods, beverages, vitamins, minerals, or medicines containing lactose. Read ingredient lists carefully.  Look for the words "lactose-free" on labels.  Use lactase enzyme drops or tablets as directed by your health care provider.  Use lactose-free milk or a milk alternative, such as soy milk or almond milk, for drinking and cooking.  Make sure you get enough calcium and vitamin D in your diet. A lactose-free eating plan can be lacking in these important nutrients.  Take calcium and vitamin D supplements as directed by your health care provider. Talk to your health care provider about supplements if you are not able to get enough calcium and vitamin D from food. What foods can I eat?  Fruits All fresh, canned, frozen, or dried fruits that are not processed with lactose. Vegetables All fresh, frozen, and canned vegetables without cheese, cream, or butter sauces. Grains Any that are not made with dairy milk or dairy products. Meats and other proteins Any meat, fish, poultry, and other protein sources that are not made with dairy milk or dairy products. Soy cheese and yogurt. Fats and oils Any that are not made with dairy milk  or dairy products. Beverages Lactose-free milk. Soy, rice, or almond milk with added calcium and vitamin D. Fruit and vegetable juices. Sweets and desserts Any that are not made with dairy milk or dairy products. Seasonings and condiments Any that are not made with dairy milk or dairy products. Calcium Calcium is found in many foods that contain lactose and is important for bone health. The amount of calcium you  need depends on your age:  Adults younger than 50 years: 1,000 mg of calcium a day.  Adults older than 50 years: 1,200 mg of calcium a day. If you are not getting enough calcium, you may get it from other sources, including:  Orange juice with calcium added. There are 300-350 mg of calcium in 1 cup of orange juice.  Calcium-fortified soy milk. There are 300-400 mg of calcium in 1 cup of calcium-fortified soy milk.  Calcium-fortified rice or almond milk. There are 300 mg of calcium in 1 cup of calcium-fortified rice or almond milk.  Calcium-fortified breakfast cereals. There are 100-1,000 mg of calcium in calcium-fortified breakfast cereals.  Spinach, cooked. There are 145 mg of calcium in  cup of cooked spinach.  Edamame, cooked. There are 130 mg of calcium in  cup of cooked edamame.  Collard greens, cooked. There are 125 mg of calcium in  cup of cooked collard greens.  Kale, frozen or cooked. There are 90 mg of calcium in  cup of cooked or frozen kale.  Almonds. There are 95 mg of calcium in  cup of almonds.  Broccoli, cooked. There are 60 mg of calcium in 1 cup of cooked broccoli. The items listed above may not be a complete list of recommended foods and beverages. Contact a dietitian for more options. What foods are not recommended? Fruits None, unless they are made with dairy milk or dairy products. Vegetables None, unless they are made with dairy milk or dairy products. Grains Any grains that are made with dairy milk or dairy products. Meats and other proteins None, unless they are made with dairy milk or dairy products. Dairy All dairy products, including milk, goat's milk, buttermilk, kefir, acidophilus milk, flavored milk, evaporated milk, condensed milk, dulce de Shaw, eggnog, yogurt, cheese, and cheese spreads. Fats and oils Any that are made with milk or milk products. Margarines and salad dressings that contain milk or cheese. Cream. Half and half. Cream  cheese. Sour cream. Chip dips made with sour cream or yogurt. Beverages Hot chocolate. Cocoa with lactose. Instant iced teas. Powdered fruit drinks. Smoothies made with dairy milk or yogurt. Sweets and desserts Any that are made with milk or milk products. Seasonings and condiments Chewing gum that has lactose. Spice blends if they contain lactose. Artificial sweeteners that contain lactose. Nondairy creamers. The items listed above may not be a complete list of foods and beverages to avoid. Contact a dietitian for more information. Summary  If you are lactose intolerant, it means that you have a hard time digesting lactose, a natural sugar found in milk and milk products.  Following a lactose-free diet can help you manage this condition.  Calcium is important for bone health and is found in many foods that contain lactose. Talk with your health care provider about other sources of calcium. This information is not intended to replace advice given to you by your health care provider. Make sure you discuss any questions you have with your health care provider. Document Released: 11/30/2001 Document Revised: 07/08/2017 Document Reviewed: 07/08/2017 Elsevier Interactive Patient  Education  2019 [employer].

## 2018-12-15 NOTE — Progress Notes (Signed)
Primary Care Physician:  Bridget Hartshorn, NP Primary Gastroenterologist:  Dr. Gala Romney   Chief Complaint  Patient presents with   Irritable Bowel Syndrome    ref Lars Mage, ANP.    HPI:   Connie Schultz is a 58 y.o. female presenting today at the request of Claris Gladden, NP, due to diarrhea.   States she has a history of chronic diarrhea for at least the past year. Over the past few weeks has gotten worse. No solid stool in about a year. Notes 5-6 loose stools per day. Watery. Frequent. Worsened postprandially. Drinking coffee worsens. No rectal bleeding. No unexplained weight loss. Fair appetite but fearful of diarrhea. Intermittent abdominal discomfort, "roaring", with some relief after diarrhea. Stomach will feel tender to touch even with clothes laying on it. No recent antibiotics. No medication changes. City water. Worse with dairy products.   Last colonoscopy over 5 years at [facility]. Believes she had 2 polyps. No first-degree relatives with colon cancer but notes her grandfather had colon cancer. Bentyl without improvement. Prescribed Questran without improvement. Believes she took Viberzi 100 mg in 2017 and actually had to take a stool softener.   GERD: used to take omeprazole (Prilosec for GERD). No PPI currently. Will take pepto intermittently. Will use up the whole bottle. No dysphagia.   Past Medical History:  Diagnosis Date   Anxiety    Depression    Essential hypertension    Hypothyroidism    Palpitations    Scoliosis     Past Surgical History:  Procedure Laterality Date   TONSILLECTOMY AND ADENOIDECTOMY      Current Outpatient Medications  Medication Sig Dispense Refill   Ascorbic Acid (VITAMIN C) 500 MG tablet Take 500 mg by mouth daily.       benzonatate (TESSALON) 200 MG capsule Take 1 capsule (200 mg total) by mouth 3 (three) times daily as needed for cough. 20 capsule 0   calcium carbonate (OS-CAL) 600 MG TABS Take 600 mg by mouth  daily.       clonazePAM (KLONOPIN) 1 MG tablet Take 1 tablet (1 mg total) by mouth at bedtime. 30 tablet 3   DULoxetine (CYMBALTA) 30 MG capsule TAKE THREE CAPSULES BY MOUTH ONCE DAILY 90 capsule 0   levothyroxine (SYNTHROID, LEVOTHROID) 75 MCG tablet TAKE ONE TABLET BY MOUTH ONCE DAILY. 90 tablet 3   metoprolol succinate (TOPROL-XL) 50 MG 24 hr tablet TAKE ONE TABLET BY MOUTH DAILY. 30 tablet 1   nitroGLYCERIN (NITROSTAT) 0.4 MG SL tablet Place 1 tablet (0.4 mg total) under the tongue every 5 (five) minutes as needed for chest pain (Also for the shoulder and arm pain and palpitations). 50 tablet 3   No current facility-administered medications for this visit.     Allergies as of 12/15/2018 - Review Complete 12/15/2018  Allergen Reaction Noted   Sulfa antibiotics Hives and Swelling 10/29/2010    Family History  Problem Relation Age of Onset   Cancer Sister        cervical   COPD Father     Social History   Socioeconomic History   Marital status: Married    Spouse name: Not on file   Number of children: 2   Years of education: Not on file   Highest education level: Not on file  Occupational History   Occupation: Designer, multimedia  Social Needs   Financial resource strain: Not on file   Food insecurity    Worry: Not on file  Inability: Not on file   Transportation needs    Medical: Not on file    Non-medical: Not on file  Tobacco Use   Smoking status: Current Every Day Smoker    Packs/day: 1.00    Types: Cigarettes   Smokeless tobacco: Never Used  Substance and Sexual Activity   Alcohol use: No    Alcohol/week: 0.0 standard drinks   Drug use: No   Sexual activity: Not on file  Lifestyle   Physical activity    Days per week: Not on file    Minutes per session: Not on file   Stress: Not on file  Relationships   Social connections    Talks on phone: Not on file    Gets together: Not on file    Attends religious service: Not on file     Active member of club or organization: Not on file    Attends meetings of clubs or organizations: Not on file    Relationship status: Not on file   Intimate partner violence    Fear of current or ex partner: Not on file    Emotionally abused: Not on file    Physically abused: Not on file    Forced sexual activity: Not on file  Other Topics Concern   Not on file  Social History Narrative   Not on file    Review of Systems: Gen: Denies any fever, chills, fatigue, weight loss, lack of appetite.  CV: Denies chest pain, heart palpitations, peripheral edema, syncope.  Resp: Denies shortness of breath at rest or with exertion. Denies wheezing or cough.  GI: see HPI GU : Denies urinary burning, urinary frequency, urinary hesitancy MS: Denies joint pain, muscle weakness, cramps, or limitation of movement.  Derm: Denies rash, itching, dry skin Psych: Denies depression, anxiety, memory loss, and confusion Heme: Denies bruising, bleeding, and enlarged lymph nodes.  Physical Exam: BP 98/66    Pulse 97    Temp (!) 97.1 F (36.2 C) (Oral)    Ht 5\' 4"  (1.626 m)    Wt 159 lb 9.6 oz (72.4 kg)    BMI 27.40 kg/m  General:   Alert and oriented. Pleasant and cooperative. Well-nourished and well-developed.  Head:  Normocephalic and atraumatic. Eyes:  Without icterus, sclera clear and conjunctiva pink.  Ears:  Normal auditory acuity. Nose:  No deformity, discharge,  or lesions. Mouth:  No deformity or lesions, oral mucosa pink.  Lungs:  Clear to auscultation bilaterally.  Heart:  S1, S2 present without murmurs appreciated.  Abdomen:  +BS, soft, non-tender and non-distended. No HSM noted. No guarding or rebound. No masses appreciated.  Rectal:  Deferred  Msk:  Symmetrical without gross deformities. Normal posture. Extremities:  Without  edema. Neurologic:  Alert and  oriented x4 Psych:  Alert and cooperative. Normal mood and affect.

## 2018-12-15 NOTE — Assessment & Plan Note (Signed)
58 year old female with year long history of diarrhea, now worsening in frequency. Doubt infectious process due to chronicity, but as symptoms have increased, will pursue stool studies and check celiac serologies. If negative stool studies, will start on Viberzi 75 mg po once to twice a day and pursue colonoscopy in near future with random colonic biopsies. Last colonoscopy over 5 years ago at [facility]; patient believes she had polyps.   Proceed with TCS with Dr. Gala Romney in near future: the risks, benefits, and alternatives have been discussed with the patient in detail. The patient states understanding and desires to proceed. Propofol due to polypharmacy

## 2018-12-16 ENCOUNTER — Telehealth: Payer: Self-pay

## 2018-12-16 NOTE — Telephone Encounter (Signed)
TCS w/Propofol w/RMR was scheduled during OV yesterday for 02/08/19 at 2:15pm. Pre-op appt 02/04/19 at 2:45pm. Pre-op letter mailed with procedure instructions.

## 2018-12-16 NOTE — Progress Notes (Signed)
cc'd to pcp 

## 2018-12-17 ENCOUNTER — Telehealth: Payer: Self-pay | Admitting: Internal Medicine

## 2018-12-17 NOTE — Telephone Encounter (Signed)
Spoke with pt . She wanted to know if she needed an appointment to get her labs drawn. Pt is aware that she doesn't need an appointment prior to having her labs drawn.

## 2018-12-17 NOTE — Telephone Encounter (Signed)
Pt was seen earlier this week and asked to speak with the nurse. Please call her at [phone]

## 2018-12-24 ENCOUNTER — Other Ambulatory Visit: Payer: Self-pay | Admitting: Gastroenterology

## 2018-12-24 MED ORDER — VIBERZI 75 MG PO TABS: 1.0000 | ORAL_TABLET | Freq: Two times a day (BID) | ORAL | 1 refills | Status: AC

## 2018-12-24 NOTE — Progress Notes (Signed)
Cdiff, stool culture, Giardia negative. Celiac serologies negative. She should already have samples of Viberzi 75 milligrams. Take one tablet each morning WITH FOOD and may increase to twice a day with food if tolerated. Monitor for constipation, severe abdominal pain, N/V. If abdominal pain, N/V occurs, stop immediately. Make sure not drinking alcohol while taking. She HAS her gallbladder, so she is a good candidate for viberzi (also no hx of pancreatitis).

## 2018-12-24 NOTE — Progress Notes (Signed)
Note is ready for pick up and pt is aware.  She is aware we are closed Friday.

## 2018-12-24 NOTE — Progress Notes (Signed)
That is fine. May return to work 7/6.

## 2018-12-24 NOTE — Progress Notes (Signed)
Pt is aware of the results and plan.  She said she discussed with Vicente Males at [facility] that she might need a note to go back to work.  She would like one saying it is OK to return to work on Monday 12/28/2018.   Forwarding to Vicente Males to advise!

## 2018-12-25 LAB — STOOL CULTURE
MICRO NUMBER:: 618706
MICRO NUMBER:: 618707
MICRO NUMBER:: 618708
SHIGA RESULT:: NOT DETECTED
SPECIMEN QUALITY:: ADEQUATE
SPECIMEN QUALITY:: ADEQUATE
SPECIMEN QUALITY:: ADEQUATE

## 2018-12-25 LAB — C. DIFFICILE GDH AND TOXIN A/B
GDH ANTIGEN: NOT DETECTED
MICRO NUMBER:: 618512
SPECIMEN QUALITY:: ADEQUATE
TOXIN A AND B: NOT DETECTED

## 2018-12-25 LAB — GIARDIA ANTIGEN
MICRO NUMBER:: 621222
RESULT:: NOT DETECTED
SPECIMEN QUALITY:: ADEQUATE

## 2018-12-25 LAB — IGA: Immunoglobulin A: 287 mg/dL (ref 47–310)

## 2018-12-25 LAB — TISSUE TRANSGLUTAMINASE, IGA: (tTG) Ab, IgA: 1 U/mL

## 2019-02-04 ENCOUNTER — Encounter (HOSPITAL_COMMUNITY)
Admission: RE | Admit: 2019-02-04 | Discharge: 2019-02-04 | Disposition: A | Discharge status: Home / Self Care | Payer: BC Managed Care – PPO | Source: Ambulatory Visit | Attending: Internal Medicine | Admitting: Internal Medicine

## 2019-02-04 ENCOUNTER — Other Ambulatory Visit (HOSPITAL_COMMUNITY)
Admission: RE | Admit: 2019-02-04 | Discharge: 2019-02-04 | Disposition: A | Discharge status: Home / Self Care | Payer: Self-pay | Source: Ambulatory Visit | Attending: Internal Medicine | Admitting: Internal Medicine

## 2019-02-04 ENCOUNTER — Other Ambulatory Visit: Payer: Self-pay

## 2019-02-04 ENCOUNTER — Telehealth: Payer: Self-pay | Admitting: *Deleted

## 2019-02-04 NOTE — Telephone Encounter (Signed)
Received message from Buckhorn C in endo that she has been unable to reach patient for her pre-op appt. Per Obie Dredge she has left several messages to call me back because they don't need additional labs before their procedures on 02/08/2019, so we can do everything over the phone if they will call me back.   Carolyn in endo stated patient was due for COVID-19 testing today. Can't tell yet if she has showed up.  Called patient mobile, unable to leave VM Called home #-[id number]

## 2019-02-05 NOTE — Telephone Encounter (Signed)
Patient did not return call. Procedure cancelled. FYI to AB

## 2019-02-05 NOTE — Telephone Encounter (Signed)
Melanie from [facility] called this morning and wanted me to follow up with pt again to see if she will go have her Covid screening ASAP.  Lmom of pt and called to notify Hoyle Sauer.  Pt must have done before 9:30 per Hoyle Sauer if she calls back.

## 2019-02-05 NOTE — Telephone Encounter (Signed)
LMOVM for pt at home #

## 2019-02-08 ENCOUNTER — Encounter (HOSPITAL_COMMUNITY): Admission: RE | Payer: Self-pay | Source: Home / Self Care

## 2019-02-08 ENCOUNTER — Ambulatory Visit (HOSPITAL_COMMUNITY)
Admission: RE | Admit: 2019-02-08 | Payer: BC Managed Care – PPO | Source: Home / Self Care | Admitting: Internal Medicine

## 2019-02-08 SURGERY — COLONOSCOPY WITH PROPOFOL
Anesthesia: Monitor Anesthesia Care

## 2019-04-12 ENCOUNTER — Other Ambulatory Visit: Payer: Self-pay | Admitting: Gastroenterology

## 2019-07-06 ENCOUNTER — Ambulatory Visit: Payer: BC Managed Care – PPO | Attending: Internal Medicine

## 2019-08-24 ENCOUNTER — Other Ambulatory Visit: Payer: Self-pay | Admitting: Gastroenterology

## 2020-07-24 ENCOUNTER — Encounter: Payer: Self-pay | Admitting: Internal Medicine

## 2020-09-06 ENCOUNTER — Encounter: Payer: Self-pay | Admitting: Internal Medicine

## 2020-09-06 ENCOUNTER — Ambulatory Visit: Payer: BC Managed Care – PPO | Admitting: Gastroenterology

## 2022-05-09 ENCOUNTER — Other Ambulatory Visit: Payer: Self-pay | Admitting: Adult Health Nurse Practitioner

## 2022-05-09 DIAGNOSIS — Z1231 Encounter for screening mammogram for malignant neoplasm of breast: Secondary | ICD-10-CM

## 2022-06-19 ENCOUNTER — Inpatient Hospital Stay: Admission: RE | Admit: 2022-06-19 | Payer: No Typology Code available for payment source | Source: Ambulatory Visit

## 2024-04-22 ENCOUNTER — Other Ambulatory Visit (HOSPITAL_COMMUNITY): Payer: Self-pay | Admitting: Internal Medicine

## 2024-04-22 DIAGNOSIS — Z87891 Personal history of nicotine dependence: Secondary | ICD-10-CM

## 2024-04-26 ENCOUNTER — Other Ambulatory Visit (HOSPITAL_COMMUNITY): Payer: Self-pay | Admitting: Internal Medicine

## 2024-04-26 DIAGNOSIS — Z13820 Encounter for screening for osteoporosis: Secondary | ICD-10-CM
